# Patient Record
Sex: Female | Born: 1935 | Race: White | Hispanic: No | Smoking: Never smoker
Health system: Southern US, Community
[De-identification: ages and names within clinical notes are randomized; demographics above are authoritative.]

## PROBLEM LIST (undated history)

## (undated) DIAGNOSIS — N390 Urinary tract infection, site not specified: Secondary | ICD-10-CM

## (undated) DIAGNOSIS — D649 Anemia, unspecified: Secondary | ICD-10-CM

## (undated) DIAGNOSIS — K219 Gastro-esophageal reflux disease without esophagitis: Secondary | ICD-10-CM

## (undated) DIAGNOSIS — I350 Nonrheumatic aortic (valve) stenosis: Secondary | ICD-10-CM

## (undated) DIAGNOSIS — H3530 Unspecified macular degeneration: Secondary | ICD-10-CM

## (undated) DIAGNOSIS — E119 Type 2 diabetes mellitus without complications: Secondary | ICD-10-CM

## (undated) DIAGNOSIS — G629 Polyneuropathy, unspecified: Secondary | ICD-10-CM

## (undated) DIAGNOSIS — H547 Unspecified visual loss: Secondary | ICD-10-CM

## (undated) DIAGNOSIS — I10 Essential (primary) hypertension: Secondary | ICD-10-CM

## (undated) DIAGNOSIS — M810 Age-related osteoporosis without current pathological fracture: Secondary | ICD-10-CM

## (undated) HISTORY — DX: Unspecified visual loss: H54.7

## (undated) HISTORY — DX: Polyneuropathy, unspecified: G62.9

## (undated) HISTORY — DX: Nonrheumatic aortic (valve) stenosis: I35.0

## (undated) HISTORY — PX: COLONOSCOPY: SHX174

## (undated) HISTORY — DX: Age-related osteoporosis without current pathological fracture: M81.0

## (undated) HISTORY — DX: Hypercalcemia: E83.52

## (undated) HISTORY — DX: Disorder of phosphorus metabolism, unspecified: E83.30

## (undated) HISTORY — DX: Essential (primary) hypertension: I10

## (undated) HISTORY — DX: Gastro-esophageal reflux disease without esophagitis: K21.9

---
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
## 1982-11-17 HISTORY — PX: CATARACT EXTRACTION: SUR2

## 1991-12-11 HISTORY — PX: CATARACT EXTRACTION: SUR2

## 2016-08-02 ENCOUNTER — Other Ambulatory Visit: Payer: Self-pay

## 2016-08-02 ENCOUNTER — Observation Stay (HOSPITAL_COMMUNITY)
Admission: EM | Admit: 2016-08-02 | Discharge: 2016-08-04 | Disposition: A | Discharge status: Home / Self Care | Payer: Medicare Other | Attending: Internal Medicine | Admitting: Internal Medicine

## 2016-08-02 ENCOUNTER — Emergency Department (HOSPITAL_COMMUNITY): Payer: Medicare Other

## 2016-08-02 ENCOUNTER — Encounter (HOSPITAL_COMMUNITY): Payer: Self-pay | Admitting: Emergency Medicine

## 2016-08-02 DIAGNOSIS — Z7984 Long term (current) use of oral hypoglycemic drugs: Secondary | ICD-10-CM | POA: Insufficient documentation

## 2016-08-02 DIAGNOSIS — K219 Gastro-esophageal reflux disease without esophagitis: Secondary | ICD-10-CM | POA: Diagnosis not present

## 2016-08-02 DIAGNOSIS — D649 Anemia, unspecified: Secondary | ICD-10-CM

## 2016-08-02 DIAGNOSIS — I350 Nonrheumatic aortic (valve) stenosis: Secondary | ICD-10-CM | POA: Diagnosis not present

## 2016-08-02 DIAGNOSIS — N390 Urinary tract infection, site not specified: Secondary | ICD-10-CM | POA: Diagnosis not present

## 2016-08-02 DIAGNOSIS — D509 Iron deficiency anemia, unspecified: Secondary | ICD-10-CM | POA: Diagnosis not present

## 2016-08-02 DIAGNOSIS — I119 Hypertensive heart disease without heart failure: Secondary | ICD-10-CM | POA: Insufficient documentation

## 2016-08-02 DIAGNOSIS — R600 Localized edema: Secondary | ICD-10-CM | POA: Insufficient documentation

## 2016-08-02 DIAGNOSIS — L03115 Cellulitis of right lower limb: Secondary | ICD-10-CM | POA: Diagnosis present

## 2016-08-02 DIAGNOSIS — Z79899 Other long term (current) drug therapy: Secondary | ICD-10-CM | POA: Insufficient documentation

## 2016-08-02 DIAGNOSIS — I700 Atherosclerosis of aorta: Secondary | ICD-10-CM | POA: Insufficient documentation

## 2016-08-02 DIAGNOSIS — R9389 Abnormal findings on diagnostic imaging of other specified body structures: Secondary | ICD-10-CM

## 2016-08-02 DIAGNOSIS — R609 Edema, unspecified: Secondary | ICD-10-CM | POA: Diagnosis present

## 2016-08-02 DIAGNOSIS — I10 Essential (primary) hypertension: Secondary | ICD-10-CM | POA: Diagnosis present

## 2016-08-02 DIAGNOSIS — E119 Type 2 diabetes mellitus without complications: Secondary | ICD-10-CM

## 2016-08-02 DIAGNOSIS — R0602 Shortness of breath: Secondary | ICD-10-CM

## 2016-08-02 DIAGNOSIS — R799 Abnormal finding of blood chemistry, unspecified: Secondary | ICD-10-CM | POA: Diagnosis present

## 2016-08-02 HISTORY — DX: Unspecified macular degeneration: H35.30

## 2016-08-02 HISTORY — DX: Type 2 diabetes mellitus without complications: E11.9

## 2016-08-02 HISTORY — DX: Anemia, unspecified: D64.9

## 2016-08-02 HISTORY — DX: Nonrheumatic aortic (valve) stenosis: I35.0

## 2016-08-02 LAB — COMPREHENSIVE METABOLIC PANEL
ALK PHOS: 69 U/L (ref 38–126)
ALT: 17 U/L (ref 14–54)
ANION GAP: 12 (ref 5–15)
AST: 22 U/L (ref 15–41)
Albumin: 3.9 g/dL (ref 3.5–5.0)
BILIRUBIN TOTAL: 0.4 mg/dL (ref 0.3–1.2)
BUN: 19 mg/dL (ref 6–20)
CALCIUM: 9.5 mg/dL (ref 8.9–10.3)
CO2: 21 mmol/L — ABNORMAL LOW (ref 22–32)
CREATININE: 0.7 mg/dL (ref 0.44–1.00)
Chloride: 100 mmol/L — ABNORMAL LOW (ref 101–111)
GFR calc Af Amer: >60 mL/min (ref >60)
GFR calc non Af Amer: >60 mL/min (ref >60)
GLUCOSE: 163 mg/dL — AB (ref 65–99)
Potassium: 3.8 mmol/L (ref 3.5–5.1)
Sodium: 133 mmol/L — ABNORMAL LOW (ref 135–145)
TOTAL PROTEIN: 6.8 g/dL (ref 6.5–8.1)

## 2016-08-02 LAB — CBC WITH DIFFERENTIAL/PLATELET
BASOS ABS: 0 10*3/uL (ref 0.0–0.1)
Basophils Relative: 0 %
Eosinophils Absolute: 0.3 10*3/uL (ref 0.0–0.7)
Eosinophils Relative: 4 %
HEMATOCRIT: 23.3 % — AB (ref 36.0–46.0)
HEMOGLOBIN: 7.1 g/dL — AB (ref 12.0–15.0)
LYMPHS PCT: 25 %
Lymphs Abs: 1.6 10*3/uL (ref 0.7–4.0)
MCH: 21 pg — AB (ref 26.0–34.0)
MCHC: 30.5 g/dL (ref 30.0–36.0)
MCV: 68.9 fL — ABNORMAL LOW (ref 78.0–100.0)
MONO ABS: 0.7 10*3/uL (ref 0.1–1.0)
MONOS PCT: 11 %
NEUTROS ABS: 3.9 10*3/uL (ref 1.7–7.7)
Neutrophils Relative %: 60 %
Platelets: 260 10*3/uL (ref 150–400)
RBC: 3.38 MIL/uL — ABNORMAL LOW (ref 3.87–5.11)
RDW: 17.7 % — ABNORMAL HIGH (ref 11.5–15.5)
WBC: 6.5 10*3/uL (ref 4.0–10.5)

## 2016-08-02 LAB — URINALYSIS, ROUTINE W REFLEX MICROSCOPIC
BILIRUBIN URINE: NEGATIVE
Glucose, UA: NEGATIVE mg/dL
Hgb urine dipstick: NEGATIVE
Ketones, ur: 5 mg/dL — AB
NITRITE: NEGATIVE
PH: 5 (ref 5.0–8.0)
Protein, ur: NEGATIVE mg/dL
SPECIFIC GRAVITY, URINE: 1.009 (ref 1.005–1.030)

## 2016-08-02 LAB — BRAIN NATRIURETIC PEPTIDE: B Natriuretic Peptide: 52.3 pg/mL (ref 0.0–100.0)

## 2016-08-02 NOTE — ED Triage Notes (Signed)
Pt here after her doctor told her to come seek treatment. Pt reports her hemoglobin is low. Pt's daughter states her hemoglobin was 7.5. Pt reports sob with exertion x multiple months.

## 2016-08-03 ENCOUNTER — Other Ambulatory Visit: Payer: Self-pay

## 2016-08-03 ENCOUNTER — Observation Stay (HOSPITAL_BASED_OUTPATIENT_CLINIC_OR_DEPARTMENT_OTHER): Payer: Medicare Other

## 2016-08-03 ENCOUNTER — Encounter (HOSPITAL_COMMUNITY): Payer: Self-pay | Admitting: Emergency Medicine

## 2016-08-03 DIAGNOSIS — D649 Anemia, unspecified: Secondary | ICD-10-CM | POA: Diagnosis not present

## 2016-08-03 DIAGNOSIS — M7989 Other specified soft tissue disorders: Secondary | ICD-10-CM

## 2016-08-03 DIAGNOSIS — R609 Edema, unspecified: Secondary | ICD-10-CM | POA: Diagnosis not present

## 2016-08-03 DIAGNOSIS — D509 Iron deficiency anemia, unspecified: Secondary | ICD-10-CM | POA: Diagnosis present

## 2016-08-03 DIAGNOSIS — N390 Urinary tract infection, site not specified: Secondary | ICD-10-CM

## 2016-08-03 LAB — CBC WITH DIFFERENTIAL/PLATELET
Basophils Absolute: 0 10*3/uL (ref 0.0–0.1)
Basophils Relative: 0 %
Eosinophils Absolute: 0.1 10*3/uL (ref 0.0–0.7)
Eosinophils Relative: 2 %
HEMATOCRIT: 27.1 % — AB (ref 36.0–46.0)
HEMOGLOBIN: 8.7 g/dL — AB (ref 12.0–15.0)
LYMPHS ABS: 1.2 10*3/uL (ref 0.7–4.0)
LYMPHS PCT: 21 %
MCH: 22.5 pg — AB (ref 26.0–34.0)
MCHC: 32.1 g/dL (ref 30.0–36.0)
MCV: 70 fL — AB (ref 78.0–100.0)
Monocytes Absolute: 0.7 10*3/uL (ref 0.1–1.0)
Monocytes Relative: 12 %
NEUTROS ABS: 3.6 10*3/uL (ref 1.7–7.7)
NEUTROS PCT: 65 %
Platelets: 249 10*3/uL (ref 150–400)
RBC: 3.87 MIL/uL (ref 3.87–5.11)
RDW: 17.9 % — ABNORMAL HIGH (ref 11.5–15.5)
WBC: 5.7 10*3/uL (ref 4.0–10.5)

## 2016-08-03 LAB — POCT I-STAT TROPONIN I: Troponin i, poc: 0.01 ng/mL (ref 0.00–0.08)

## 2016-08-03 LAB — GLUCOSE, CAPILLARY
GLUCOSE-CAPILLARY: 144 mg/dL — AB (ref 65–99)
GLUCOSE-CAPILLARY: 182 mg/dL — AB (ref 65–99)
GLUCOSE-CAPILLARY: 125 mg/dL — AB (ref 65–99)
Glucose-Capillary: 138 mg/dL — ABNORMAL HIGH (ref 65–99)

## 2016-08-03 LAB — VITAMIN B12: Vitamin B-12: 215 pg/mL (ref 180–914)

## 2016-08-03 LAB — FERRITIN: FERRITIN: 5 ng/mL — AB (ref 11–307)

## 2016-08-03 LAB — HCG, SERUM, QUALITATIVE: Preg, Serum: NEGATIVE

## 2016-08-03 LAB — IRON AND TIBC
Iron: 16 ug/dL — ABNORMAL LOW (ref 28–170)
SATURATION RATIOS: 3 % — AB (ref 10.4–31.8)
TIBC: 469 ug/dL — ABNORMAL HIGH (ref 250–450)
UIBC: 453 ug/dL

## 2016-08-03 LAB — PREPARE RBC (CROSSMATCH)

## 2016-08-03 LAB — BRAIN NATRIURETIC PEPTIDE: B NATRIURETIC PEPTIDE 5: 94.4 pg/mL (ref 0.0–100.0)

## 2016-08-03 LAB — OCCULT BLOOD X 1 CARD TO LAB, STOOL: FECAL OCCULT BLD: NEGATIVE

## 2016-08-03 LAB — POCT PREGNANCY, URINE: Preg Test, Ur: POSITIVE — AB

## 2016-08-03 LAB — ABO/RH: ABO/RH(D): B POS

## 2016-08-03 LAB — HCG, QUANTITATIVE, PREGNANCY: hCG, Beta Chain, Quant, S: 1 m[IU]/mL (ref <5)

## 2016-08-03 MED ORDER — POLYVINYL ALCOHOL 1.4 % OP SOLN
2.0000 [drp] | OPHTHALMIC | Status: DC | PRN
  Administered 2016-08-03: 2 [drp] via OPHTHALMIC
  Filled 2016-08-03: qty 15

## 2016-08-03 MED ORDER — IBUPROFEN 200 MG PO TABS
400.0000 mg | ORAL_TABLET | Freq: Once | ORAL | Status: AC
  Administered 2016-08-03: 400 mg via ORAL
  Filled 2016-08-03: qty 2

## 2016-08-03 MED ORDER — SODIUM CHLORIDE 0.9 % IV SOLN
Freq: Once | INTRAVENOUS | Status: AC
  Administered 2016-08-03: 01:00:00 via INTRAVENOUS

## 2016-08-03 MED ORDER — FAMOTIDINE 20 MG PO TABS
20.0000 mg | ORAL_TABLET | Freq: Every day | ORAL | Status: DC
  Administered 2016-08-03 (×2): 20 mg via ORAL
  Filled 2016-08-03 (×2): qty 1

## 2016-08-03 MED ORDER — RISAQUAD PO CAPS
1.0000 | ORAL_CAPSULE | Freq: Every day | ORAL | Status: DC
  Administered 2016-08-03 – 2016-08-04 (×2): 1 via ORAL
  Filled 2016-08-03 (×2): qty 1

## 2016-08-03 MED ORDER — PANTOPRAZOLE SODIUM 40 MG IV SOLR
40.0000 mg | Freq: Two times a day (BID) | INTRAVENOUS | Status: DC
  Administered 2016-08-03 – 2016-08-04 (×3): 40 mg via INTRAVENOUS
  Filled 2016-08-03 (×3): qty 40

## 2016-08-03 MED ORDER — POLYETHYLENE GLYCOL 3350 17 G PO PACK: 17.0000 g | PACK | Freq: Every day | ORAL | Status: DC | PRN

## 2016-08-03 MED ORDER — SALINE SPRAY 0.65 % NA SOLN: 2.0000 | NASAL | Status: DC | PRN

## 2016-08-03 MED ORDER — SODIUM CHLORIDE 0.9 % IV SOLN
510.0000 mg | Freq: Once | INTRAVENOUS | Status: AC
  Administered 2016-08-03: 510 mg via INTRAVENOUS
  Filled 2016-08-03: qty 17

## 2016-08-03 MED ORDER — SODIUM CHLORIDE 0.9 % IV SOLN: 25.0000 mg | Freq: Once | INTRAVENOUS | Status: DC

## 2016-08-03 MED ORDER — INSULIN ASPART 100 UNIT/ML ~~LOC~~ SOLN
0.0000 [IU] | Freq: Three times a day (TID) | SUBCUTANEOUS | Status: DC
  Administered 2016-08-03: 3 [IU] via SUBCUTANEOUS
  Administered 2016-08-03 (×2): 2 [IU] via SUBCUTANEOUS
  Administered 2016-08-04 (×2): 3 [IU] via SUBCUTANEOUS

## 2016-08-03 MED ORDER — ONDANSETRON HCL 4 MG PO TABS: 4.0000 mg | ORAL_TABLET | Freq: Four times a day (QID) | ORAL | Status: DC | PRN

## 2016-08-03 MED ORDER — ACETAMINOPHEN 325 MG PO TABS
650.0000 mg | ORAL_TABLET | Freq: Four times a day (QID) | ORAL | Status: DC | PRN
  Filled 2016-08-03: qty 2

## 2016-08-03 MED ORDER — PANTOPRAZOLE SODIUM 40 MG PO TBEC
80.0000 mg | DELAYED_RELEASE_TABLET | Freq: Every day | ORAL | Status: DC
Start: 2016-08-03 — End: 2016-08-03
  Filled 2016-08-03: qty 2

## 2016-08-03 MED ORDER — CALCIUM CARBONATE ANTACID 500 MG PO CHEW
2.0000 | CHEWABLE_TABLET | Freq: Every day | ORAL | Status: DC
  Administered 2016-08-03 – 2016-08-04 (×2): 400 mg via ORAL
  Filled 2016-08-03 (×2): qty 2

## 2016-08-03 MED ORDER — SODIUM CHLORIDE 0.9 % IV SOLN
Freq: Once | INTRAVENOUS | Status: AC
  Administered 2016-08-03: 02:00:00 via INTRAVENOUS

## 2016-08-03 MED ORDER — ONDANSETRON HCL 4 MG/2ML IJ SOLN: 4.0000 mg | Freq: Four times a day (QID) | INTRAMUSCULAR | Status: DC | PRN

## 2016-08-03 MED ORDER — ACETAMINOPHEN 650 MG RE SUPP: 650.0000 mg | Freq: Four times a day (QID) | RECTAL | Status: DC | PRN

## 2016-08-03 NOTE — Progress Notes (Signed)
**  Preliminary report by tech**  Bilateral lower extremity venous duplex completed. There is no evidence of deep or superficial vein thrombosis involving the right and left lower extremities. All visualized vessels appear patent and compressible. There is no evidence of Baker's cysts bilaterally.  08/03/16 9:12 AM Olen CordialGreg Karlynn Furrow RVT

## 2016-08-03 NOTE — Care Management Obs Status (Signed)
MEDICARE OBSERVATION STATUS NOTIFICATION   Patient Details  Name: Stacey Ritter MRN: [id number] Date of Birth: 27-Aug-1926   Medicare Observation Status Notification Given:  Yes    Lanier ClamMahabir, Dereon Corkery, RN 08/03/2016, 12:32 PM

## 2016-08-03 NOTE — ED Provider Notes (Signed)
[facility]-EMERGENCY DEPT Provider Note   CSN: [id number] Arrival date & time: 08/02/16  2003  By signing my name below, I, Stacey Ritter, attest that this documentation has been prepared under the direction and in the presence of Stacey Clopper, MD  Electronically Signed: Thelma BargeNick Ritter, Scribe. 08/03/16. 12:38 AM. History   Chief Complaint Chief Complaint  Patient presents with  . Abnormal Lab   The history is provided by the patient and a relative. No language interpreter was used.  Shortness of Breath  This is a new problem. The problem occurs continuously.The current episode started more than 1 week ago. The problem has been gradually worsening. Associated symptoms include leg swelling. Pertinent negatives include no fever, no hemoptysis, no chest pain, no rash and no claudication. It is unknown what precipitated the problem. Risk factors: none. She has tried nothing for the symptoms. The treatment provided no relief. She has had no prior ICU admissions. Associated medical issues do not include PE.    HPI Comments: Stacey Ritter is a 83 y.o. female with PMHx of anemia, GERD, DM, HTN, HLD, and macular degeneration who presents to the Emergency Department complaining of constant, gradually worsening bilateral lower extremity swelling for 2 weeks. This is a new issue. She has associated exertional SOB (but this is not a new concern), leg cramps, and decreased appetite. [facility] and they reported her hemoglobin was 7.5 and referred her to come here. She does not know her baseline hemoglobin. She denies fever, melena, hematuria, and rashes. Pt is here visiting her daughter since July 1 until August and has not seen her PCP since Stacey Ritter. She [facility] in [place]. Past Medical History:  Diagnosis Date  . Anemia   . Diabetes mellitus without complication (HCC)   . Macular degeneration     There are no active problems to display for this  patient.   History reviewed. No pertinent surgical history.  OB History    No data available       Home Medications    Prior to Admission medications   Medication Sig Start Date End Date Taking? Authorizing Provider  acidophilus (RISAQUAD) CAPS capsule Take 1 capsule by mouth daily.   Yes [provider]  calcium carbonate (TUMS - DOSED IN MG ELEMENTAL CALCIUM) 500 MG chewable tablet Chew 2 tablets by mouth daily.   Yes [provider]  esomeprazole (NEXIUM) 40 MG capsule Take 40 mg by mouth daily at 12 noon.   Yes [provider]  glimepiride (AMARYL) 4 MG tablet Take 4 mg by mouth daily with breakfast.   Yes [provider]  Menthol, Topical Analgesic, (BIOFREEZE EX) Apply 1 application topically daily as needed (pain).   Yes [provider]  Multiple Vitamins-Minerals (ICAPS MV PO) Take 1 tablet by mouth daily.   Yes [provider]  polyethylene glycol (MIRALAX / GLYCOLAX) packet Take 17 g by mouth daily as needed for mild constipation.   Yes [provider]  ranitidine (ZANTAC) 150 MG tablet Take 150 mg by mouth at bedtime.   Yes [provider]  sitaGLIPtin-metformin (JANUMET) 50-1000 MG tablet Take 1 tablet by mouth 2 (two) times daily with a meal.   Yes [provider]  sodium chloride (OCEAN) 0.65 % SOLN nasal spray Place 2 sprays into both nostrils as needed for congestion.   Yes [provider]  Wheat Dextrin (BENEFIBER PO) Take 1-2 tablets by mouth daily as needed (constipation).  Yes [provider]    Family History No family history on file.  Social History Social History  Substance Use Topics  . Smoking status: Never Smoker  . Smokeless tobacco: Never Used  . Alcohol use No     Allergies   Ampicillin and Penicillins   Review of Systems Review of Systems  Constitutional: Positive for appetite change. Negative for chills and fever.  HENT: Negative for  drooling and facial swelling.   Eyes: Negative for photophobia.  Respiratory: Positive for shortness of breath. Negative for hemoptysis.   Cardiovascular: Positive for leg swelling. Negative for chest pain, palpitations and claudication.  Gastrointestinal: Negative for anal bleeding.  Genitourinary: Negative for difficulty urinating and hematuria.  Musculoskeletal: Positive for joint swelling. Negative for neck stiffness.       Leg cramping  Skin: Negative for pallor and rash.  Neurological: Negative for facial asymmetry and speech difficulty.  Psychiatric/Behavioral: Negative for suicidal ideas.  All other systems reviewed and are negative.    Physical Exam Updated Vital Signs BP (!) 165/70 (BP Location: Right Arm)   Pulse 80   Temp 98.4 F (36.9 C) (Oral)   Resp 17   Wt 150 lb (68 kg)   SpO2 95%   Physical Exam  Constitutional: She is oriented to person, place, and time. She appears well-developed and well-nourished. No distress.  HENT:  Head: Normocephalic and atraumatic.  Mouth/Throat: No oropharyngeal exudate.  Eyes: Pupils are equal, round, and reactive to light. Conjunctivae and EOM are normal.  Neck: Normal range of motion. Neck supple. No JVD present.  Cardiovascular: Normal rate and regular rhythm.   Pulmonary/Chest: Breath sounds normal. No respiratory distress. She has no wheezes.  All compartments are soft Intact dorsalis pedis pulse Regular rate, rhythm clear lungs, No struider, bruits, or JVD  Abdominal: Soft. Bowel sounds are normal. She exhibits no distension. There is no tenderness. There is no rebound and no guarding.  Genitourinary: No vaginal discharge found.  Musculoskeletal: Normal range of motion. She exhibits edema.  Bilateral pedal edema  Neurological: She is alert and oriented to person, place, and time. She displays normal reflexes.  Skin: Skin is warm and dry. Capillary refill takes less than 2 seconds. No rash noted. No erythema.    Psychiatric: She has a normal mood and affect.  Nursing note and vitals reviewed.    ED Treatments / Results  DIAGNOSTIC STUDIES: Oxygen Saturation is 95% on RA, normal by my interpretation.    COORDINATION OF CARE: 12:38 AM Discussed treatment plan with pt at bedside and pt agreed to plan.  Labs (all labs ordered are listed, but only abnormal results are displayed)  Results for orders placed or performed during the hospital encounter of 08/02/16  CBC with Differential/Platelet  Result Value Ref Range   WBC 6.5 4.0 - 10.5 K/uL   RBC 3.38 (L) 3.87 - 5.11 MIL/uL   Hemoglobin 7.1 (L) 12.0 - 15.0 g/dL   HCT 16.1 (L) 09.6 - 04.5 %   MCV 68.9 (L) 78.0 - 100.0 fL   MCH 21.0 (L) 26.0 - 34.0 pg   MCHC 30.5 30.0 - 36.0 g/dL   RDW 40.9 (H) 81.1 - 91.4 %   Platelets 260 150 - 400 K/uL   Neutrophils Relative % 60 %   Neutro Abs 3.9 1.7 - 7.7 K/uL   Lymphocytes Relative 25 %   Lymphs Abs 1.6 0.7 - 4.0 K/uL   Monocytes Relative 11 %   Monocytes Absolute 0.7 0.1 -  1.0 K/uL   Eosinophils Relative 4 %   Eosinophils Absolute 0.3 0.0 - 0.7 K/uL   Basophils Relative 0 %   Basophils Absolute 0.0 0.0 - 0.1 K/uL   Smear Review MORPHOLOGY UNREMARKABLE   Comprehensive metabolic panel  Result Value Ref Range   Sodium 133 (L) 135 - 145 mmol/L   Potassium 3.8 3.5 - 5.1 mmol/L   Chloride 100 (L) 101 - 111 mmol/L   CO2 21 (L) 22 - 32 mmol/L   Glucose, Bld 163 (H) 65 - 99 mg/dL   BUN 19 6 - 20 mg/dL   Creatinine, Ser 1.02 0.44 - 1.00 mg/dL   Calcium 9.5 8.9 - 72.5 mg/dL   Total Protein 6.8 6.5 - 8.1 g/dL   Albumin 3.9 3.5 - 5.0 g/dL   AST 22 15 - 41 U/L   ALT 17 14 - 54 U/L   Alkaline Phosphatase 69 38 - 126 U/L   Total Bilirubin 0.4 0.3 - 1.2 mg/dL   GFR calc non Af Amer >60 >60 mL/min   GFR calc Af Amer >60 >60 mL/min   Anion gap 12 5 - 15  Brain natriuretic peptide  Result Value Ref Range   B Natriuretic Peptide 52.3 0.0 - 100.0 pg/mL  Urinalysis, Routine w reflex microscopic   Result Value Ref Range   Color, Urine YELLOW YELLOW   APPearance CLEAR CLEAR   Specific Gravity, Urine 1.009 1.005 - 1.030   pH 5.0 5.0 - 8.0   Glucose, UA NEGATIVE NEGATIVE mg/dL   Hgb urine dipstick NEGATIVE NEGATIVE   Bilirubin Urine NEGATIVE NEGATIVE   Ketones, ur 5 (A) NEGATIVE mg/dL   Protein, ur NEGATIVE NEGATIVE mg/dL   Nitrite NEGATIVE NEGATIVE   Leukocytes, UA MODERATE (A) NEGATIVE   RBC / HPF 0-5 0 - 5 RBC/hpf   WBC, UA 6-30 0 - 5 WBC/hpf   Bacteria, UA MANY (A) NONE SEEN   Squamous Epithelial / LPF 0-5 (A) NONE SEEN   Dg Chest 2 View  Result Date: 08/02/2016 CLINICAL DATA:  Shortness of Breath EXAM: CHEST  2 VIEW COMPARISON:  08/02/2016 FINDINGS: Cardiac shadow is stable in appearance. Aortic calcifications are again seen. Lungs are well aerated bilaterally. A vague nodular density is noted in the right upper lobe similar to that seen on chest x-ray obtained 4 hours previous. No acute bony abnormality is seen. Stable lower thoracic compression deformity is noted. IMPRESSION: Stable nodular changes in the right upper lobe similar to that seen 4 hours previous. CT is again recommended for further evaluation. Electronically Signed   By: Alcide Clever M.D.   On: 08/02/2016 22:23    EKG   EKG Interpretation  Date/Time:  Tuesday August 03 2016 00:47:19 EDT Ventricular Rate:  81 PR Interval:    QRS Duration: 72 QT Interval:  356 QTC Calculation: 414 R Axis:   18 Text Interpretation:  Sinus rhythm Borderline prolonged PR interval Confirmed by Laquanda Bick ([id number]) on 08/03/2016 12:50:00 AM       Radiology Dg Chest 2 View  Result Date: 08/02/2016 CLINICAL DATA:  Shortness of Breath EXAM: CHEST  2 VIEW COMPARISON:  08/02/2016 FINDINGS: Cardiac shadow is stable in appearance. Aortic calcifications are again seen. Lungs are well aerated bilaterally. A vague nodular density is noted in the right upper lobe similar to that seen on chest x-ray obtained 4 hours previous. No acute  bony abnormality is seen. Stable lower thoracic compression deformity is noted. IMPRESSION: Stable nodular changes in the right upper  lobe similar to that seen 4 hours previous. CT is again recommended for further evaluation. Electronically Signed   By: Alcide Clever M.D.   On: 08/02/2016 22:23    Procedures Procedures (including critical care time)     Final Clinical Impressions(s) / ED Diagnoses   Symptomatic anemia and peripheral edema: will admit to medicine  I personally performed the services described in this documentation, which was scribed in my presence. The recorded information has been reviewed and is accurate.       Danean Marner, MD 08/03/16 [phone]

## 2016-08-03 NOTE — Progress Notes (Addendum)
  PROGRESS NOTE  Patient admitted earlier this morning. See H&P. Patient currently residing in [place] who is in town visiting daughter. About 2 weeks ago, she started to notice bilateral lower extremity swelling, leg cramping, and had worsening shortness of breath. She went to an urgent care to get her legs checked out, was told her hemoglobin was low, and [facility]   Exam shows +3 systolic heart murmur, which per patient is chronic Bilateral +1 LE edema  Vascular US negative for DVT Check echo  Urine pregnancy positive. She is 83 yo. Checked serum preg test which is negative. This was a lab error.  Has hx of iron deficiency anemia but has not been taking iron supplements, no visible blood loss per patient. She received 1u pRBC. FOBT is pending. Her last colonoscopy was 8 years ago which was normal, last EGD few years ago found hiatal hernia. She has GERD, takes baby asprin and rare NSAIDs.  Iron IV supplement    Stacey StainJennifer Rudi Knippenberg, DO [facility] [email or web address] Password [email or web address] 08/03/2016, 9:50 AM

## 2016-08-03 NOTE — H&P (Signed)
History and Physical    Anaiyah Anglemyer UJW:[id number] DOB: [birth year] DOA: 08/02/2016  PCP: Patient, No Pcp Per (PCP is in [place])  Patient coming from: Home  I have personally briefly reviewed patient's old medical records in [facility]  Chief Complaint: Leg swelling  HPI: Kami Kube is a 83 y.o. female with medical history significant of DM2, macular degeneration, chronic anemia (unknown baseline HGB).  Patient was diagnosed previously with iron deficiency anemia and told to start iron supplementation earlier this month; however, patient didn't start on this.  Since traveling down from [place] around July 1st or so patient has had persistent BLE edema.  This has been gradually worsening for past 2 weeks.  Has associated exertional SOB but this isnt new.  Also has leg cramps and decreased appetite.  Patient went to UC earlier today, HGB 7.5.  Got sent to ED.   ED Course: HGB 7.1.   Review of Systems: As per HPI otherwise 10 point review of systems negative.   Past Medical History:  Diagnosis Date  . Anemia   . Diabetes mellitus without complication (HCC)   . Macular degeneration     History reviewed. No pertinent surgical history.   reports that she has never smoked. She has never used smokeless tobacco. She reports that she does not drink alcohol or use drugs.  Allergies  Allergen Reactions  . Ampicillin Rash  . Penicillins Rash    Has patient had a PCN reaction causing immediate rash, facial/tongue/throat swelling, SOB or lightheadedness with hypotension: Yes Has patient had a PCN reaction causing severe rash involving mucus membranes or skin necrosis: No Has patient had a PCN reaction that required hospitalization: No Has patient had a PCN reaction occurring within the last 10 years: No If all of the above answers are "NO", then may proceed with Cephalosporin use.     No family history on file.   Prior to Admission medications     Medication Sig Start Date End Date Taking? Authorizing Provider  acidophilus (RISAQUAD) CAPS capsule Take 1 capsule by mouth daily.   Yes [provider]  calcium carbonate (TUMS - DOSED IN MG ELEMENTAL CALCIUM) 500 MG chewable tablet Chew 2 tablets by mouth daily.   Yes [provider]  esomeprazole (NEXIUM) 40 MG capsule Take 40 mg by mouth daily at 12 noon.   Yes [provider]  glimepiride (AMARYL) 4 MG tablet Take 4 mg by mouth daily with breakfast.   Yes [provider]  Menthol, Topical Analgesic, (BIOFREEZE EX) Apply 1 application topically daily as needed (pain).   Yes [provider]  Multiple Vitamins-Minerals (ICAPS MV PO) Take 1 tablet by mouth daily.   Yes [provider]  polyethylene glycol (MIRALAX / GLYCOLAX) packet Take 17 g by mouth daily as needed for mild constipation.   Yes [provider]  ranitidine (ZANTAC) 150 MG tablet Take 150 mg by mouth at bedtime.   Yes [provider]  sitaGLIPtin-metformin (JANUMET) 50-1000 MG tablet Take 1 tablet by mouth 2 (two) times daily with a meal.   Yes [provider]  sodium chloride (OCEAN) 0.65 % SOLN nasal spray Place 2 sprays into both nostrils as needed for congestion.   Yes [provider]  Wheat Dextrin (BENEFIBER PO) Take 1-2 tablets by mouth daily as needed (constipation).   Yes [provider]    Physical Exam: Vitals:   08/03/16 0000 08/03/16 0030 08/03/16 0100 08/03/16 0115  BP: (!) 155/66 123/72 Marland Kitchen)  147/71   Pulse: 85 90 81 82  Resp: (!) 21 15 16 17   Temp:      TempSrc:      SpO2: 97% 100% 97% 98%  Weight:        Constitutional: NAD, calm, comfortable Eyes: PERRL, lids and conjunctivae normal ENMT: Mucous membranes are moist. Posterior pharynx clear of any exudate or lesions.Normal dentition.  Neck: normal, supple, no masses, no thyromegaly Respiratory: clear to auscultation bilaterally, no wheezing, no crackles.  Normal respiratory effort. No accessory muscle use.  Cardiovascular: Regular rate and rhythm, no murmurs / rubs / gallops. No extremity edema. 2+ pedal pulses. No carotid bruits.  Abdomen: no tenderness, no masses palpated. No hepatosplenomegaly. Bowel sounds positive.  Musculoskeletal: no clubbing / cyanosis. No joint deformity upper and lower extremities. Good ROM, no contractures. Normal muscle tone.  Skin: no rashes, lesions, ulcers. No induration Neurologic: CN 2-12 grossly intact. Sensation intact, DTR normal. Strength 5/5 in all 4.  Psychiatric: Normal judgment and insight. Alert and oriented x 3. Normal mood.    Labs on Admission: I have personally reviewed following labs and imaging studies  CBC:  Recent Labs Lab 08/02/16 2151  WBC 6.5  NEUTROABS 3.9  HGB 7.1*  HCT 23.3*  MCV 68.9*  PLT 260   Basic Metabolic Panel:  Recent Labs Lab 08/02/16 2151  NA 133*  K 3.8  CL 100*  CO2 21*  GLUCOSE 163*  BUN 19  CREATININE 0.70  CALCIUM 9.5   GFR: CrCl cannot be calculated (Unknown ideal weight.). Liver Function Tests:  Recent Labs Lab 08/02/16 2151  AST 22  ALT 17  ALKPHOS 69  BILITOT 0.4  PROT 6.8  ALBUMIN 3.9   No results for input(s): LIPASE, AMYLASE in the last 168 hours. No results for input(s): AMMONIA in the last 168 hours. Coagulation Profile: No results for input(s): INR, PROTIME in the last 168 hours. Cardiac Enzymes: No results for input(s): CKTOTAL, CKMB, CKMBINDEX, TROPONINI in the last 168 hours. BNP (last 3 results) No results for input(s): PROBNP in the last 8760 hours. HbA1C: No results for input(s): HGBA1C in the last 72 hours. CBG: No results for input(s): GLUCAP in the last 168 hours. Lipid Profile: No results for input(s): CHOL, HDL, LDLCALC, TRIG, CHOLHDL, LDLDIRECT in the last 72 hours. Thyroid Function Tests: No results for input(s): TSH, T4TOTAL, FREET4, T3FREE, THYROIDAB in the last 72 hours. Anemia Panel: No results for  input(s): VITAMINB12, FOLATE, FERRITIN, TIBC, IRON, RETICCTPCT in the last 72 hours. Urine analysis:    Component Value Date/Time   COLORURINE YELLOW 08/02/2016 2230   APPEARANCEUR CLEAR 08/02/2016 2230   LABSPEC 1.009 08/02/2016 2230   PHURINE 5.0 08/02/2016 2230   GLUCOSEU NEGATIVE 08/02/2016 2230   HGBUR NEGATIVE 08/02/2016 2230   BILIRUBINUR NEGATIVE 08/02/2016 2230   KETONESUR 5 (A) 08/02/2016 2230   PROTEINUR NEGATIVE 08/02/2016 2230   NITRITE NEGATIVE 08/02/2016 2230   LEUKOCYTESUR MODERATE (A) 08/02/2016 2230    Radiological Exams on Admission: Dg Chest 2 View  Result Date: 08/02/2016 CLINICAL DATA:  Shortness of Breath EXAM: CHEST  2 VIEW COMPARISON:  08/02/2016 FINDINGS: Cardiac shadow is stable in appearance. Aortic calcifications are again seen. Lungs are well aerated bilaterally. A vague nodular density is noted in the right upper lobe similar to that seen on chest x-ray obtained 4 hours previous. No acute bony abnormality is seen. Stable lower thoracic compression deformity is noted. IMPRESSION: Stable nodular changes in the right upper lobe similar to that  seen 4 hours previous. CT is again recommended for further evaluation. Electronically Signed   By: Alcide CleverMark  Lukens M.D.   On: 08/02/2016 22:23    EKG: Independently reviewed.  Assessment/Plan Principal Problem:   Iron deficiency anemia Active Problems:   Symptomatic anemia   Chronic UTI   Peripheral edema    1. Symptomatic Iron Deficiency anemia - 1. Presumably peripheral edema secondary to this 2. Will transfuse 1 unit PRBC 3. Hemoccult pending 2. Peripheral edema - 1. BLE edema, most likely secondary to anemia 2. But will get BLE US to r/o DVT 3. Chronic UTI - 1. Urine cx pending 2. No UTI symptoms, no SIRS, and doubt that this is causing her peripheral edema. 3. Given extensive h/o UTI on UAs in past (Patient says UA always shows UTI, "sometimes my doctor gives me an antibiotic and sometimes he doesn't")  and above, will hold off on ABx for now.  DVT prophylaxis: None pending hemoccult and BLE US to r/o DVT (though latter felt to be less likely). Code Status: Full Family Communication: Daughter at bedside Disposition Plan: Home after admit Consults called: None Admission status: Place in 55obs   Hillary BowGARDNER, Raine Blodgett M. DO [facility] Pager [phone]  If 7AM-7PM, please contact day team taking care of patient [email or web address] Password [email or web address]  08/03/2016, 1:37 AM

## 2016-08-03 NOTE — Care Management Note (Signed)
Case Management Note  Patient Details  Name: Jani FilesMargaret Diekmann MRN: [id number] Date of Birth: Dec 10, 1926  Subjective/Objective:     Spoke to patient/dtr in rm about ABN notice of non coverage-patient/dtr voiced understanding. MD/administration notified.                Action/Plan:d/c plan home.   Expected Discharge Date:                  Expected Discharge Plan:  Home/Self Care  In-House Referral:     Discharge planning Services  CM Consult  Post Acute Care Choice:    Choice offered to:     DME Arranged:    DME Agency:     HH Arranged:    HH Agency:     Status of Service:  In process, will continue to follow  If discussed at Long Length of Stay Meetings, dates discussed:    Additional Comments:  Lanier ClamMahabir, Keshan Reha, RN 08/03/2016, 3:45 PM

## 2016-08-03 NOTE — Care Management Note (Signed)
Case Management Note  Patient Details  Name: Stacey FilesMargaret Ritter MRN: [id number] Date of Birth: November 02, 1926  Subjective/Objective: 83 y/o f admitted w/Iron deficiency. Vis[place] from [place] in [place]. No further CM needs.                   Action/Plan:d/c plan home.   Expected Discharge Date:                  Expected Discharge Plan:  Home/Self Care  In-House Referral:     Discharge planning Services  CM Consult  Post Acute Care Choice:    Choice offered to:     DME Arranged:    DME Agency:     HH Arranged:    HH Agency:     Status of Service:  In process, will continue to follow  If discussed at Long Length of Stay Meetings, dates discussed:    Additional Comments:  Lanier ClamMahabir, Taylorann Tkach, RN 08/03/2016, 12:32 PM

## 2016-08-04 ENCOUNTER — Observation Stay (HOSPITAL_BASED_OUTPATIENT_CLINIC_OR_DEPARTMENT_OTHER): Payer: Medicare Other

## 2016-08-04 ENCOUNTER — Encounter (HOSPITAL_COMMUNITY): Payer: Self-pay | Admitting: Radiology

## 2016-08-04 ENCOUNTER — Observation Stay (HOSPITAL_COMMUNITY): Payer: Medicare Other

## 2016-08-04 DIAGNOSIS — E118 Type 2 diabetes mellitus with unspecified complications: Secondary | ICD-10-CM | POA: Diagnosis not present

## 2016-08-04 DIAGNOSIS — I340 Nonrheumatic mitral (valve) insufficiency: Secondary | ICD-10-CM

## 2016-08-04 DIAGNOSIS — D649 Anemia, unspecified: Secondary | ICD-10-CM | POA: Diagnosis not present

## 2016-08-04 DIAGNOSIS — I350 Nonrheumatic aortic (valve) stenosis: Secondary | ICD-10-CM

## 2016-08-04 DIAGNOSIS — L03115 Cellulitis of right lower limb: Secondary | ICD-10-CM | POA: Diagnosis not present

## 2016-08-04 DIAGNOSIS — D509 Iron deficiency anemia, unspecified: Secondary | ICD-10-CM

## 2016-08-04 DIAGNOSIS — E119 Type 2 diabetes mellitus without complications: Secondary | ICD-10-CM

## 2016-08-04 DIAGNOSIS — I10 Essential (primary) hypertension: Secondary | ICD-10-CM | POA: Diagnosis present

## 2016-08-04 DIAGNOSIS — R609 Edema, unspecified: Secondary | ICD-10-CM

## 2016-08-04 LAB — GLUCOSE, CAPILLARY
GLUCOSE-CAPILLARY: 185 mg/dL — AB (ref 65–99)
Glucose-Capillary: 183 mg/dL — ABNORMAL HIGH (ref 65–99)

## 2016-08-04 LAB — CBC
HEMATOCRIT: 25.6 % — AB (ref 36.0–46.0)
HEMOGLOBIN: 8.2 g/dL — AB (ref 12.0–15.0)
MCH: 22 pg — AB (ref 26.0–34.0)
MCHC: 32 g/dL (ref 30.0–36.0)
MCV: 68.8 fL — ABNORMAL LOW (ref 78.0–100.0)
Platelets: 200 10*3/uL (ref 150–400)
RBC: 3.72 MIL/uL — ABNORMAL LOW (ref 3.87–5.11)
RDW: 17.7 % — AB (ref 11.5–15.5)
WBC: 6.2 10*3/uL (ref 4.0–10.5)

## 2016-08-04 LAB — BPAM RBC
Blood Product Expiration Date: 201808032359
ISSUE DATE / TIME: 201807240148
Unit Type and Rh: 7300

## 2016-08-04 LAB — MAGNESIUM: Magnesium: 1.8 mg/dL (ref 1.7–2.4)

## 2016-08-04 LAB — ECHOCARDIOGRAM COMPLETE
HEIGHTINCHES: 61 in
Weight: 2384.5 oz

## 2016-08-04 LAB — TYPE AND SCREEN
ABO/RH(D): B POS
Antibody Screen: NEGATIVE
UNIT DIVISION: 0

## 2016-08-04 LAB — BASIC METABOLIC PANEL
Anion gap: 9 (ref 5–15)
BUN: 15 mg/dL (ref 6–20)
CALCIUM: 9.3 mg/dL (ref 8.9–10.3)
CHLORIDE: 103 mmol/L (ref 101–111)
CO2: 25 mmol/L (ref 22–32)
CREATININE: 0.7 mg/dL (ref 0.44–1.00)
GFR calc non Af Amer: >60 mL/min (ref >60)
Glucose, Bld: 154 mg/dL — ABNORMAL HIGH (ref 65–99)
Potassium: 3.8 mmol/L (ref 3.5–5.1)
SODIUM: 137 mmol/L (ref 135–145)

## 2016-08-04 MED ORDER — AMLODIPINE BESYLATE 5 MG PO TABS: 2.5000 mg | ORAL_TABLET | Freq: Every day | ORAL | Status: DC

## 2016-08-04 MED ORDER — HYDRALAZINE HCL 20 MG/ML IJ SOLN
5.0000 mg | Freq: Once | INTRAMUSCULAR | Status: DC
  Filled 2016-08-04: qty 0.25

## 2016-08-04 MED ORDER — PANTOPRAZOLE SODIUM 40 MG PO TBEC
40.0000 mg | DELAYED_RELEASE_TABLET | Freq: Two times a day (BID) | ORAL | Status: DC
  Filled 2016-08-04: qty 1

## 2016-08-04 MED ORDER — DOXYCYCLINE HYCLATE 100 MG PO TABS
100.0000 mg | ORAL_TABLET | Freq: Two times a day (BID) | ORAL | 0 refills | Status: AC
Start: 2016-08-04 — End: 2016-08-11

## 2016-08-04 MED ORDER — IOPAMIDOL (ISOVUE-370) INJECTION 76%
INTRAVENOUS | Status: AC
  Filled 2016-08-04: qty 100

## 2016-08-04 MED ORDER — POLYSACCHARIDE IRON COMPLEX 150 MG PO CAPS: 150.0000 mg | ORAL_CAPSULE | Freq: Every day | ORAL | Status: DC

## 2016-08-04 MED ORDER — POLYSACCHARIDE IRON COMPLEX 150 MG PO CAPS: 150.0000 mg | ORAL_CAPSULE | Freq: Every day | ORAL | 0 refills | Status: DC

## 2016-08-04 MED ORDER — IOPAMIDOL (ISOVUE-370) INJECTION 76%
100.0000 mL | Freq: Once | INTRAVENOUS | Status: AC | PRN
  Administered 2016-08-04: 100 mL via INTRAVENOUS

## 2016-08-04 MED ORDER — CARVEDILOL 3.125 MG PO TABS: 3.1250 mg | ORAL_TABLET | Freq: Two times a day (BID) | ORAL | 2 refills | Status: DC

## 2016-08-04 MED ORDER — CARVEDILOL 3.125 MG PO TABS
3.1250 mg | ORAL_TABLET | Freq: Two times a day (BID) | ORAL | Status: DC
  Administered 2016-08-04: 3.125 mg via ORAL
  Filled 2016-08-04: qty 1

## 2016-08-04 MED ORDER — DOXYCYCLINE HYCLATE 100 MG PO TABS
100.0000 mg | ORAL_TABLET | Freq: Two times a day (BID) | ORAL | Status: DC
  Administered 2016-08-04: 100 mg via ORAL
  Filled 2016-08-04: qty 1

## 2016-08-04 NOTE — Progress Notes (Signed)
  Echocardiogram 2D Echocardiogram has been performed.  Stacey Ritter 08/04/2016, 1:10 PM

## 2016-08-04 NOTE — Care Management Note (Signed)
Case Management Note  Patient Details  Name: Stacey FilesMargaret Ritter MRN: [id number] Date of Birth: 03-Oct-1926  Subjective/Objective:                    Action/Plan:d/c home.   Expected Discharge Date:  08/04/16               Expected Discharge Plan:  Home/Self Care  In-House Referral:     Discharge planning Services  CM Consult  Post Acute Care Choice:    Choice offered to:     DME Arranged:    DME Agency:     HH Arranged:    HH Agency:     Status of Service:  Completed, signed off  If discussed at [employer] Length of Stay Meetings, dates discussed:    Additional Comments:  Lanier ClamMahabir, Charlean Carneal, RN 08/04/2016, 4:04 PM

## 2016-08-04 NOTE — Progress Notes (Signed)
PHARMACIST - PHYSICIAN COMMUNICATION  DR:  Janee Mornhompson  CONCERNING: IV to Oral Route Change Policy  RECOMMENDATION: This patient is receiving Protonix by the intravenous route.  Based on criteria approved by the Pharmacy and Therapeutics Committee, the intravenous medication(s) is/are being converted to the equivalent oral dose form(s).   DESCRIPTION: These criteria include:  The patient is eating (either orally or via tube) and/or has been taking other orally administered medications for a least 24 hours  The patient has no evidence of active gastrointestinal bleeding or impaired GI absorption (gastrectomy, short bowel, patient on TNA or NPO).  If you have questions about this conversion, please contact the Pharmacy Department  []   [phone]( [phone] )  [facility] []   [phone]( [phone] )  [facility] []   [phone]( [phone] )  [facility] []   [phone]( [phone] )  [facility] [x]   [phone]( [phone] )  [facility]   Emily FilbertLilliston, Cortlandt Capuano ParisMichelle, [facility] SyracuseRPH 08/04/2016 9:38 AM

## 2016-08-04 NOTE — Progress Notes (Signed)
Went over medications and discharge paperwork. VSS.  Pt wheeled out by NT.

## 2016-08-04 NOTE — Discharge Summary (Addendum)
Physician Discharge Summary  Stacey FilesMargaret Ritter ZOX:[id number] DOB: August 18, 1926 DOA: 08/02/2016  PCP: Patient, No Pcp Per  Admit date: 08/02/2016 Discharge date: 08/04/2016  Time spent: 65 minutes  Recommendations for Outpatient Follow-up:  1. Follow-up with PCP in 2-3 weeks. On follow-up patient in need a CBC done to follow-up on H&H. Patient will also need a referral to follow-up with cardiology for her moderate aortic valvular stenosis.   Discharge Diagnoses:  Principal Problem:   Iron deficiency anemia Active Problems:   Symptomatic anemia   Chronic UTI   Peripheral edema   Cellulitis of leg, right   DM (diabetes mellitus) (HCC)   Aortic valve stenosis, moderate: Per 2-D echo 08/04/2016   HTN (hypertension)   Discharge Condition: Stable and improved  Diet recommendation: Heart healthy  Filed Weights   08/02/16 2052 08/03/16 0254  Weight: 68 kg (150 lb) 67.6 kg (149 lb 0.5 oz)    History of present illness:  Per Dr. Gracy RacerGardner  Stacey Ritter is a 83 y.o. female with medical history significant of DM2, macular degeneration, chronic anemia (unknown baseline HGB).  Patient was diagnosed previously with iron deficiency anemia and told to start iron supplementation earlier this month; however, patient didn't start on this.  [place] so patient has had persistent BLE edema.  This has been gradually worsening for past 2 weeks.  Has associated exertional SOB but this isnt new.  Also has leg cramps and decreased appetite.  Patient went to UC earlier today, HGB 7.5.  Got sent to ED.  Hospital Course:  #1 symptomatic iron deficiency anemia Patient had presented with worsening shortness of breath as well as lower extremity edema and noted to have a hemoglobin of 7.5. Patient had no overt bleeding during the hospitalization. Patient transfused 1 unit packed red blood cells with hemoglobin coming up to 8.2 remaining stable. Patient also  given a dose of IV Feraheme during the hospitalization. Patient will be started on nu iron 150 mg daily. Outpatient follow-up.  #2 peripheral edema Patient had presented bilateral lower extremity edema which was felt could have been secondary to symptomatic iron deficiency anemia versus dependent edema. Lower extremity Dopplers were obtained which were negative for DVT. CT angiogram of the chest was also obtained which was negative for PE. Patient's LFTs were within normal limits. Patient's renal function was also within normal limits. Urinalysis which was done was negative for proteinuria. 2-D echo which was done had a EF of 60-65%, no wall motion abnormalities, grade 1 diastolic dysfunction, moderate aortic valvular stenosis. Patient's lower extremity edema improved during the hospitalization. Outpatient follow-up.  #3 probable right greater than left lower extremity cellulitis During the hospitalization patient noted to have some erythema as well as some warmth right lower extremity greater than left. Patient remained afebrile. Patient will be discharged home empirically on a seven-day course of oral doxycycline. Outpatient follow-up.  #4 moderate aortic valvular stenosis Per 2-D echo. EF of 60-65%. Patient will need outpatient follow-up with cardiology for further evaluation and management.  #5 diabetes mellitus Remained stable throughout the hospitalization. Patient's oral hypoglycemic agents were held. Patient was maintained on sliding scale insulin. Outpatient follow-up.  #7 hypertension Patient noted to be hypertensive during the hospitalization and a such patient will be started on Coreg 3.125 mg twice a day with close outpatient follow-up.  Procedures:  1 unit packed red blood cells  2-D echo 08/04/2016  CT angiogram chest 08/04/2016  Lower extremity Doppler 08/03/2016  Consultations:  None  Discharge Exam: Vitals:   08/04/16 0423 08/04/16 1440  BP: (!) 138/55 (!) 177/65   Pulse: 72 75  Resp: 19 20  Temp: 98.7 F (37.1 C) 98.1 F (36.7 C)    General: NAD Cardiovascular: RRR Respiratory: CTAB Le: ERYTHEMA R > L, SOME WARMTH.  Discharge Instructions   Discharge Instructions    Diet - low sodium heart healthy    Complete by:  As directed    Increase activity slowly    Complete by:  As directed      Current Discharge Medication List    START taking these medications   Details  carvedilol (COREG) 3.125 MG tablet Take 1 tablet (3.125 mg total) by mouth 2 (two) times daily with a meal. Qty: 60 tablet, Refills: 2    doxycycline (VIBRA-TABS) 100 MG tablet Take 1 tablet (100 mg total) by mouth every 12 (twelve) hours. Take for 1 week then stop. Qty: 14 tablet, Refills: 0    iron polysaccharides (NU-IRON) 150 MG capsule Take 1 capsule (150 mg total) by mouth daily. Qty: 30 capsule, Refills: 0      CONTINUE these medications which have NOT CHANGED   Details  acidophilus (RISAQUAD) CAPS capsule Take 1 capsule by mouth daily.    calcium carbonate (TUMS - DOSED IN MG ELEMENTAL CALCIUM) 500 MG chewable tablet Chew 2 tablets by mouth daily.    esomeprazole (NEXIUM) 40 MG capsule Take 40 mg by mouth daily at 12 noon.    glimepiride (AMARYL) 4 MG tablet Take 4 mg by mouth daily with breakfast.    Menthol, Topical Analgesic, (BIOFREEZE EX) Apply 1 application topically daily as needed (pain).    Multiple Vitamins-Minerals (ICAPS MV PO) Take 1 tablet by mouth daily.    polyethylene glycol (MIRALAX / GLYCOLAX) packet Take 17 g by mouth daily as needed for mild constipation.    ranitidine (ZANTAC) 150 MG tablet Take 150 mg by mouth at bedtime.    sitaGLIPtin-metformin (JANUMET) 50-1000 MG tablet Take 1 tablet by mouth 2 (two) times daily with a meal.    sodium chloride (OCEAN) 0.65 % SOLN nasal spray Place 2 sprays into both nostrils as needed for congestion.    Wheat Dextrin (BENEFIBER PO) Take 1-2 tablets by mouth daily as needed (constipation).        Allergies  Allergen Reactions  . Ampicillin Rash  . Penicillins Rash    Has patient had a PCN reaction causing immediate rash, facial/tongue/throat swelling, SOB or lightheadedness with hypotension: Yes Has patient had a PCN reaction causing severe rash involving mucus membranes or skin necrosis: No Has patient had a PCN reaction that required hospitalization: No Has patient had a PCN reaction occurring within the last 10 years: No If all of the above answers are "NO", then may proceed with Cephalosporin use.    Follow-up Information    pcp. Schedule an appointment as soon as possible for a visit in 2 week(s).   Why:  f/u with PCP in 2-3 weeks.           The results of significant diagnostics from this hospitalization (including imaging, microbiology, ancillary and laboratory) are listed below for reference.    Significant Diagnostic Studies: Dg Chest 2 View  Result Date: 08/02/2016 CLINICAL DATA:  Shortness of Breath EXAM: CHEST  2 VIEW COMPARISON:  08/02/2016 FINDINGS: Cardiac shadow is stable in appearance. Aortic calcifications are again seen. Lungs are well aerated bilaterally. A vague nodular density is noted in the  right upper lobe similar to that seen on chest x-ray obtained 4 hours previous. No acute bony abnormality is seen. Stable lower thoracic compression deformity is noted. IMPRESSION: Stable nodular changes in the right upper lobe similar to that seen 4 hours previous. CT is again recommended for further evaluation. Electronically Signed   By: Alcide CleverMark  Lukens M.D.   On: 08/02/2016 22:23   Ct Angio Chest Pe W Or Wo Contrast  Result Date: 08/04/2016 CLINICAL DATA:  Persistent BILATERAL lower extremity edema worsening over past 2 weeks with associated exertional shortness of breath, leg cramps, decreased appetite, history type II diabetes mellitus, anemia EXAM: CT ANGIOGRAPHY CHEST WITH CONTRAST TECHNIQUE: Multidetector CT imaging of the chest was performed using the  standard protocol during bolus administration of intravenous contrast. Multiplanar CT image reconstructions and MIPs were obtained to evaluate the vascular anatomy. CONTRAST:  100 cc Isovue 370 IV COMPARISON:  None FINDINGS: Cardiovascular: Atherosclerotic calcifications aorta without aneurysm or dissection. No pericardial effusion. Heart unremarkable. Pulmonary arteries well opacified and patent. No evidence of pulmonary embolism. Mediastinum/Nodes: Base of cervical region unremarkable. No thoracic adenopathy. Esophagus unremarkable. Lungs/Pleura: Calcified granuloma RIGHT lower lobe image 52. Minimal dependent atelectasis. No acute infiltrate, pleural effusion or pneumothorax. Upper Abdomen: Visualized upper abdomen unremarkable Musculoskeletal: Diffuse osseous demineralization. Compression fractures at T9 and L1 with significant anterior height losses. Additional minimal superior endplate compression fracture at T8 vertebral body. Review of the MIP images confirms the above findings. IMPRESSION: No evidence of pulmonary embolism. No acute intrathoracic abnormalities. Osseous demineralization with significant compression fractures of T9 and L1, and minimally of superior endplate of T8. Aortic Atherosclerosis (ICD10-I70.0). Electronically Signed   By: Ulyses SouthwardMark  Boles M.D.   On: 08/04/2016 11:21    Microbiology: Recent Results (from the past 240 hour(s))  Culture, Urine     Status: Abnormal (Preliminary result)   Collection Time: 08/02/16 10:30 PM  Result Value Ref Range Status   Specimen Description URINE, RANDOM  Final   Special Requests NONE  Final   Culture >=100,000 COLONIES/mL ESCHERICHIA COLI (A)  Final   Report Status PENDING  Incomplete     Labs: Basic Metabolic Panel:  Recent Labs Lab 08/02/16 2151 08/04/16 0438  NA 133* 137  K 3.8 3.8  CL 100* 103  CO2 21* 25  GLUCOSE 163* 154*  BUN 19 15  CREATININE 0.70 0.70  CALCIUM 9.5 9.3  MG  --  1.8   Liver Function Tests:  Recent  Labs Lab 08/02/16 2151  AST 22  ALT 17  ALKPHOS 69  BILITOT 0.4  PROT 6.8  ALBUMIN 3.9   No results for input(s): LIPASE, AMYLASE in the last 168 hours. No results for input(s): AMMONIA in the last 168 hours. CBC:  Recent Labs Lab 08/02/16 2151 08/03/16 0820 08/04/16 0438  WBC 6.5 5.7 6.2  NEUTROABS 3.9 3.6  --   HGB 7.1* 8.7* 8.2*  HCT 23.3* 27.1* 25.6*  MCV 68.9* 70.0* 68.8*  PLT 260 249 200   Cardiac Enzymes: No results for input(s): CKTOTAL, CKMB, CKMBINDEX, TROPONINI in the last 168 hours. BNP: BNP (last 3 results)  Recent Labs  08/02/16 2151 08/03/16 0815  BNP 52.3 94.4    ProBNP (last 3 results) No results for input(s): PROBNP in the last 8760 hours.  CBG:  Recent Labs Lab 08/03/16 1202 08/03/16 1709 08/03/16 2109 08/04/16 0811 08/04/16 1223  GLUCAP 144* 182* 125* 183* 185*       Signed:  Gaelan Glennon MD.  [facility] 08/04/2016, 4:43  PM   

## 2016-08-05 LAB — URINE CULTURE

## 2018-01-09 IMAGING — CR DG CHEST 2V
2 series · 2 of 2 positions shown · non-contrast
Comparison: 08/02/2016

CLINICAL DATA: Shortness of Breath

EXAM:
CHEST  2 VIEW

[w chest pa]
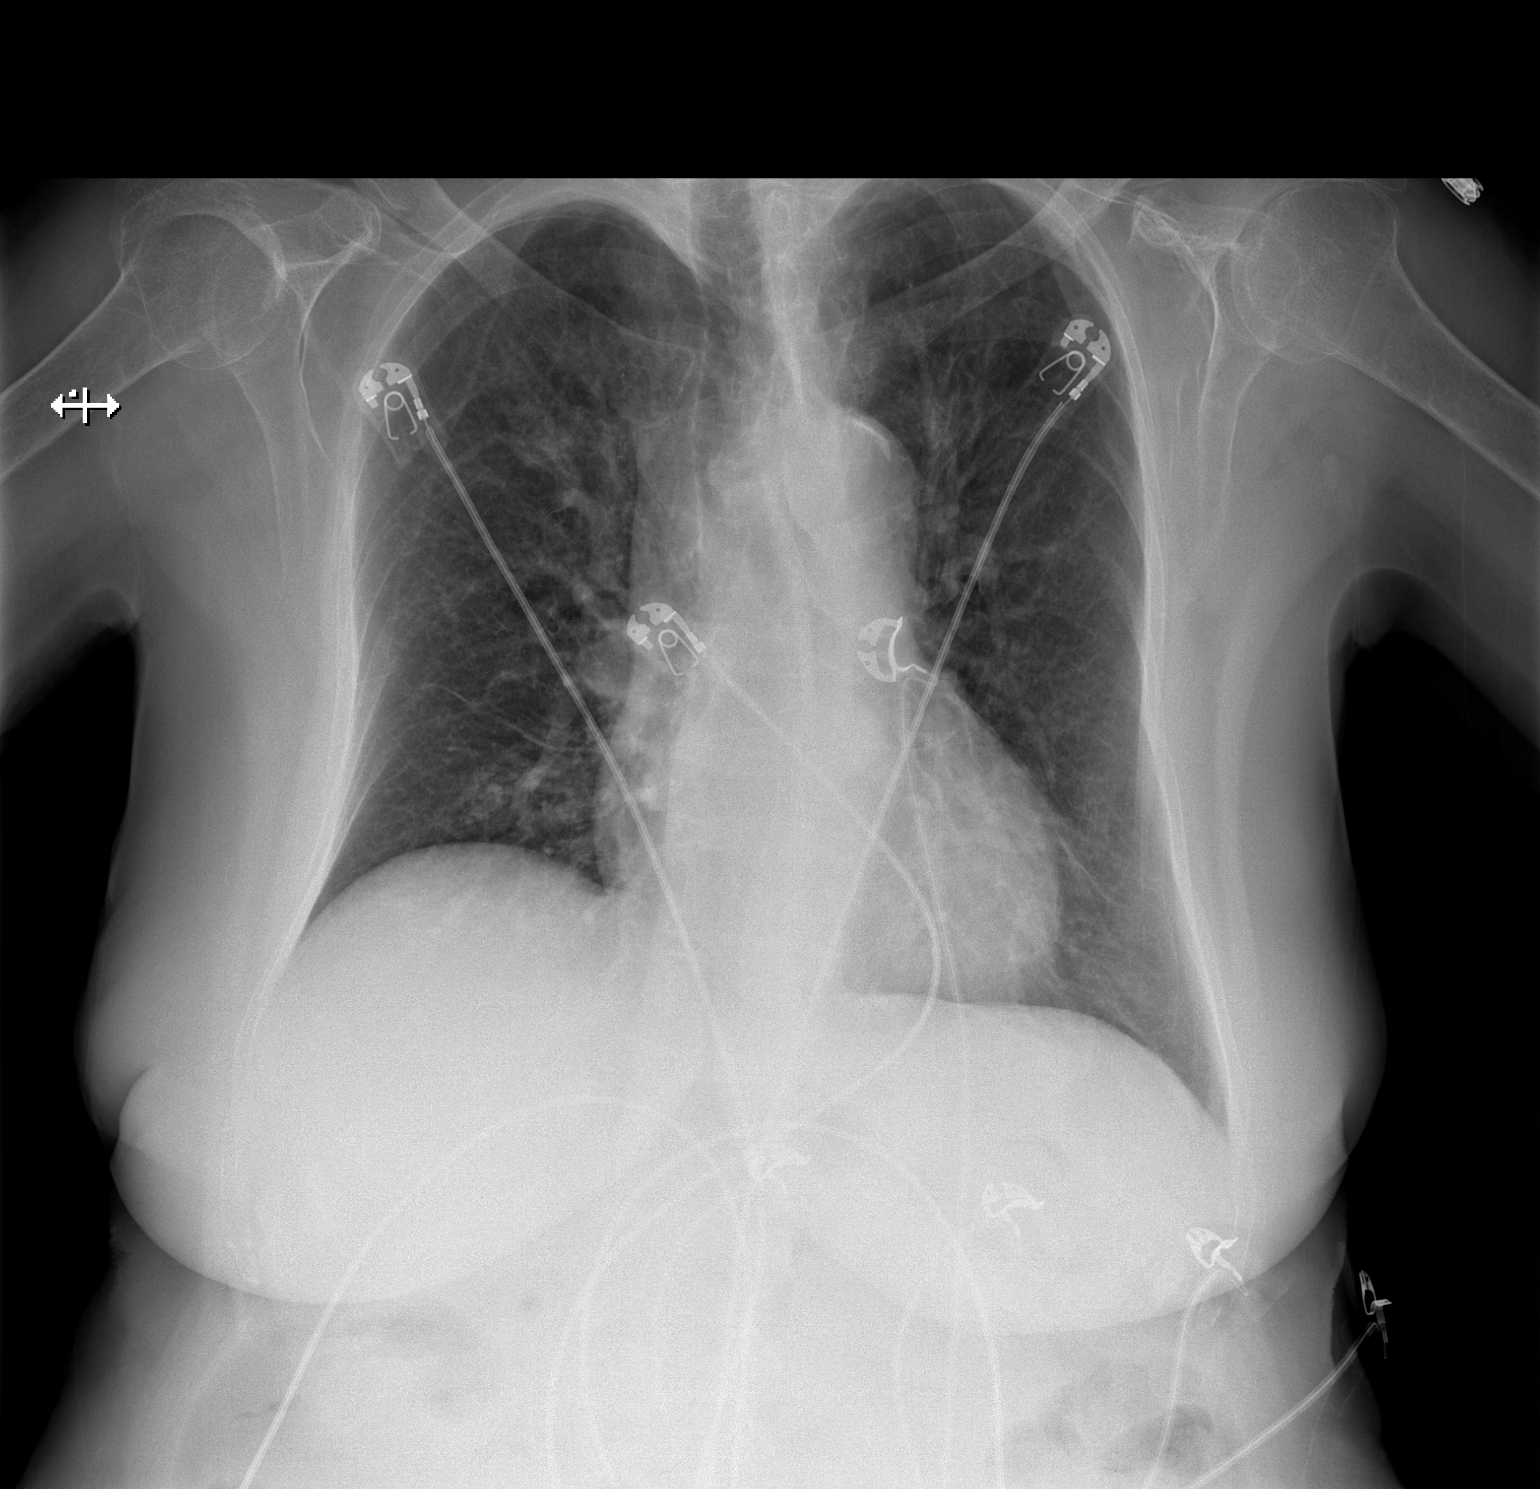

[w chest lat]
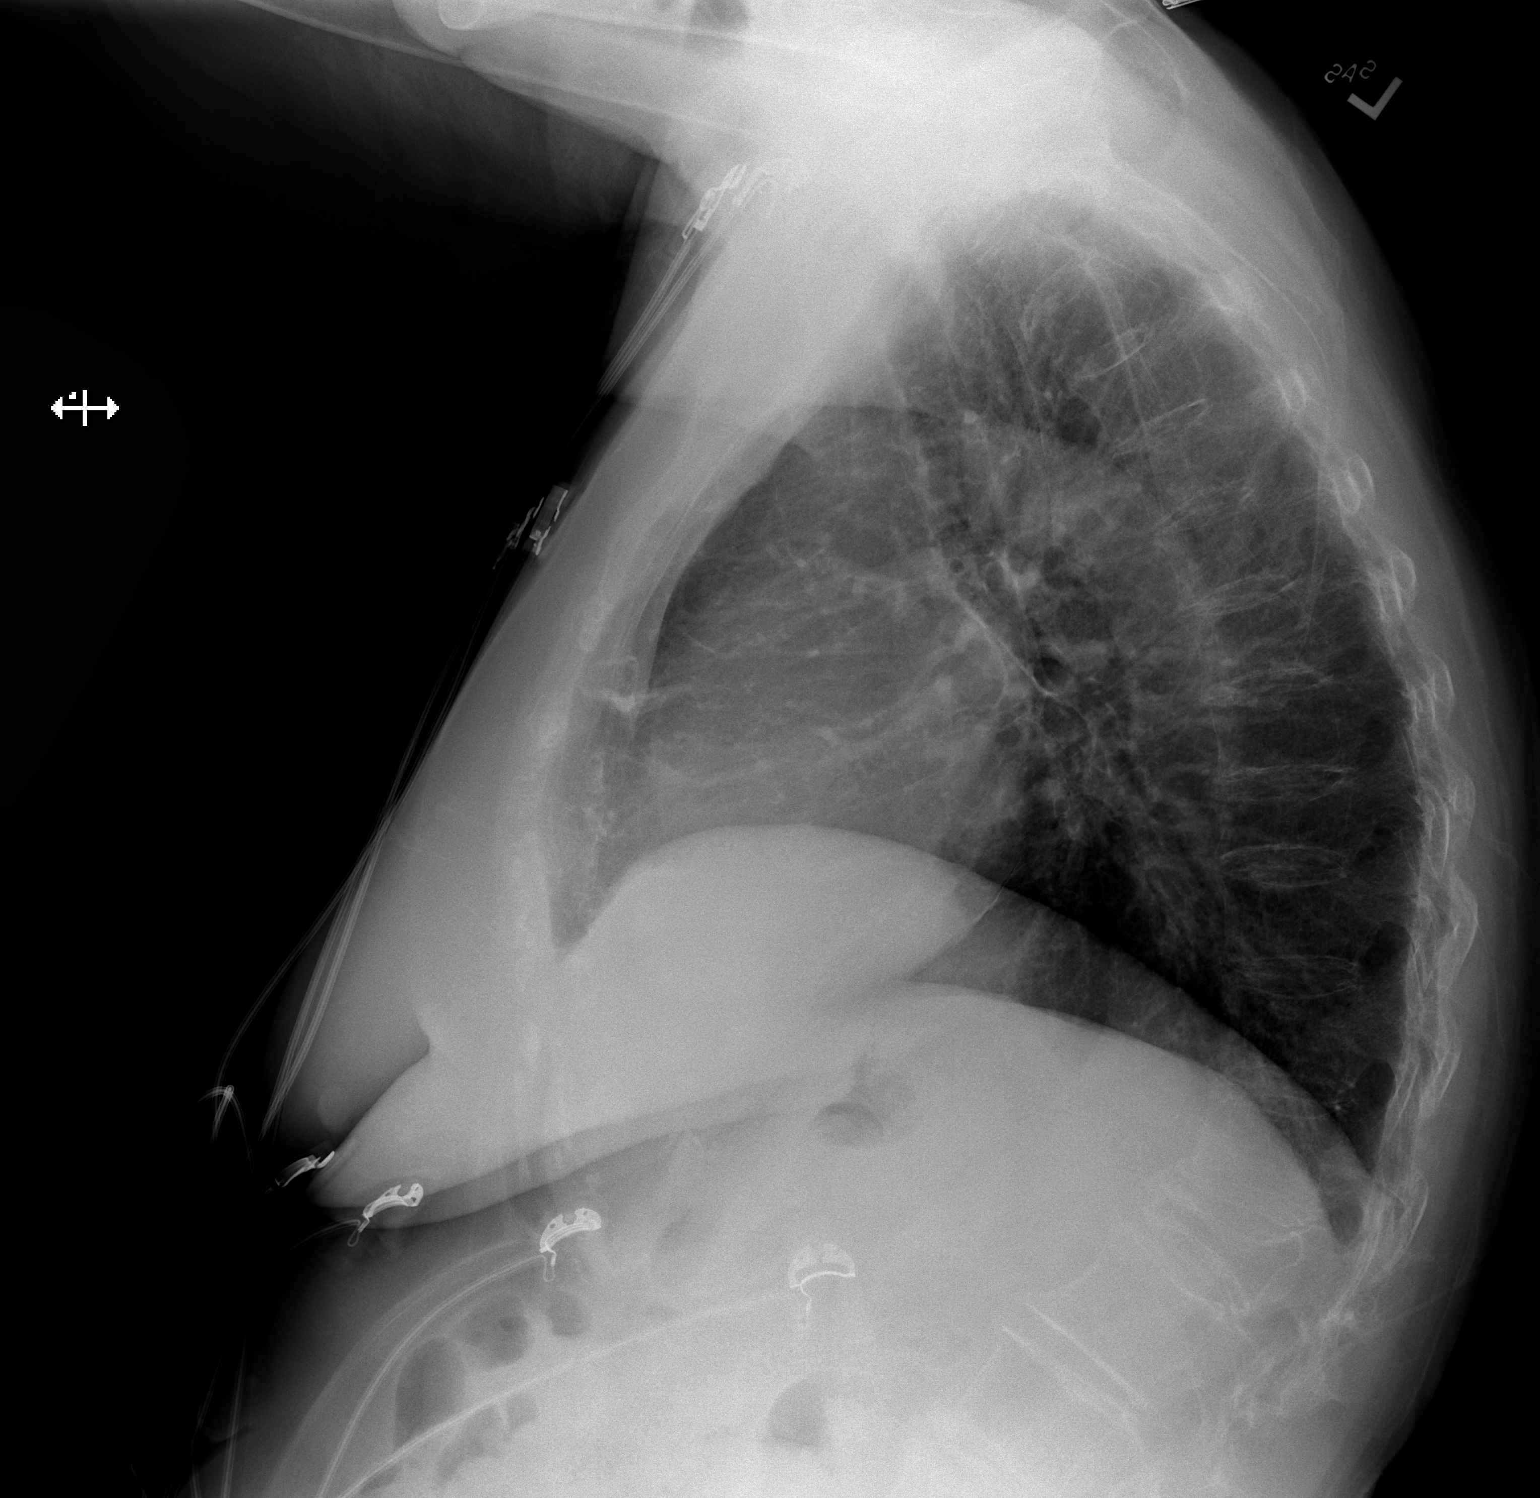

[2 of 2 positions shown; findings below may reference images not displayed]

FINDINGS: Cardiac shadow is stable in appearance. Aortic calcifications are
again seen. Lungs are well aerated bilaterally. A vague nodular
density is noted in the right upper lobe similar to that seen on
chest x-ray obtained 4 hours previous. No acute bony abnormality is
seen. Stable lower thoracic compression deformity is noted.
IMPRESSION: Stable nodular changes in the right upper lobe similar to that seen
4 hours previous. CT is again recommended for further evaluation.

## 2018-01-11 IMAGING — CT CT ANGIO CHEST
2 of 7 series · 18 of 46 positions shown · IV contrast (APPLIED)
Comparison: None

ADDENDUM:
I just spoke by telephone with Dr. Bardales who was requesting
clarification over the right upper lobe "vague nodular density"
described on chest x-ray of 08/02/2016 and whether or not this
lesion is present on the chest CT reported below. I have reviewed
this study in a focused fashion, assessing lung windows pertaining
to the right upper lobe. There is no right upper lobe lesion as
suggested on the chest x-ray performed 08/02/2016. Findings on the
chest x-ray likely related to superimposition of the anterior first
right rib end and pulmonary vascularity.
CLINICAL DATA: Persistent BILATERAL lower extremity edema worsening
over past 2 weeks with associated exertional shortness of breath,
leg cramps, decreased appetite, history type II diabetes mellitus,
anemia

EXAM:
CT ANGIOGRAPHY CHEST WITH CONTRAST
TECHNIQUE: Multidetector CT imaging of the chest was performed using the
standard protocol during bolus administration of intravenous
contrast. Multiplanar CT image reconstructions and MIPs were
obtained to evaluate the vascular anatomy.
CONTRAST:  100 cc Isovue 370 IV

[Series 5: thins · axial · 0.74mm/px · z∈[+1295,+1548]mm · 16 of 287 slices shown]
[im 17/287  lung]
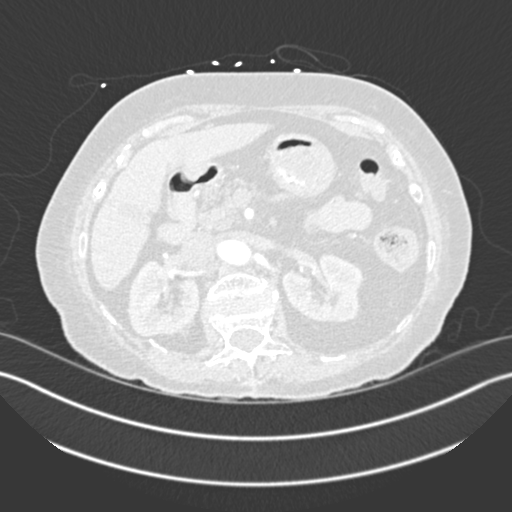
[im 34/287  soft-tissue]
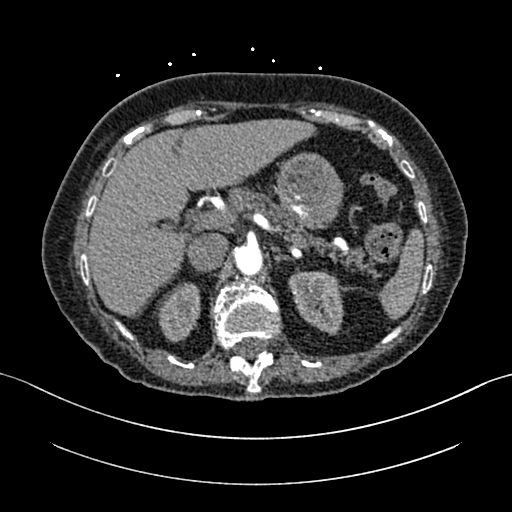
[im 51/287  lung]
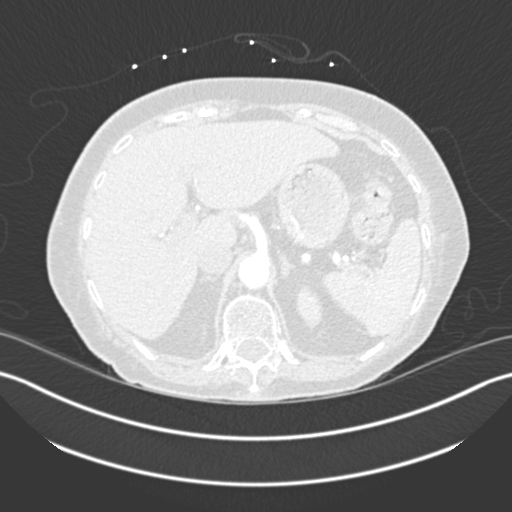
[im 68/287  soft-tissue]
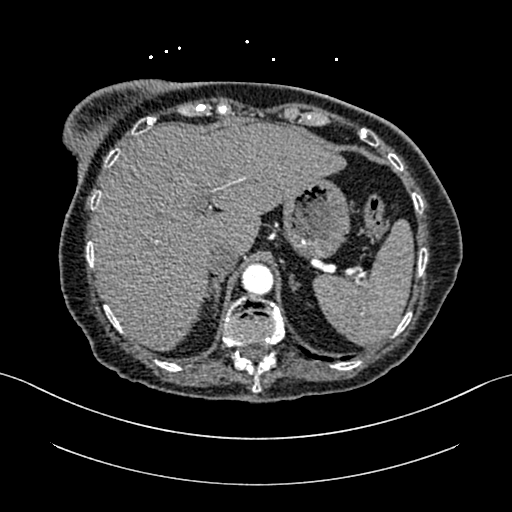
[im 85/287  lung]
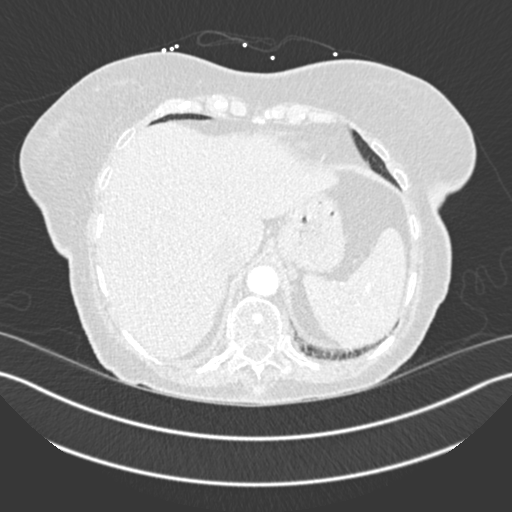
[im 101/287  soft-tissue]
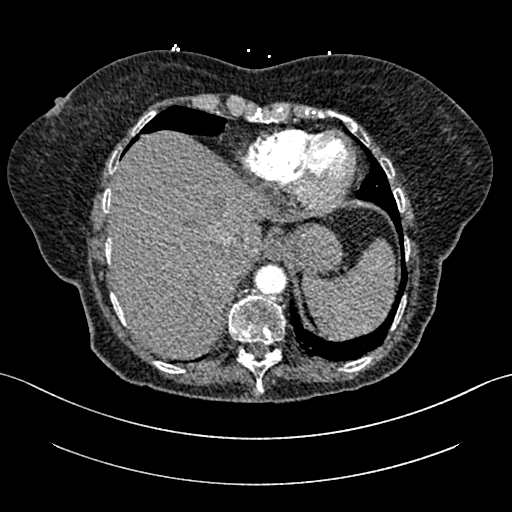
[im 118/287  lung]
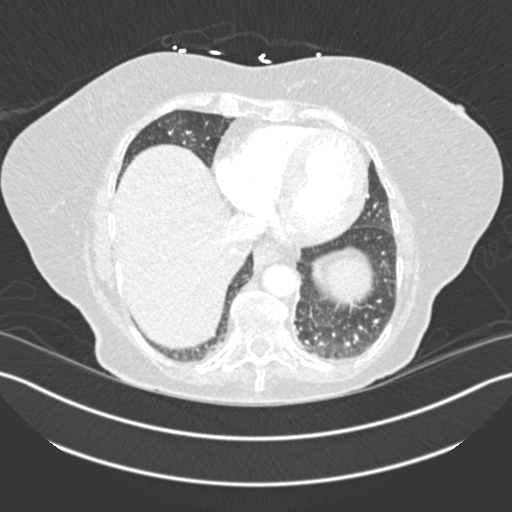
[im 135/287  soft-tissue]
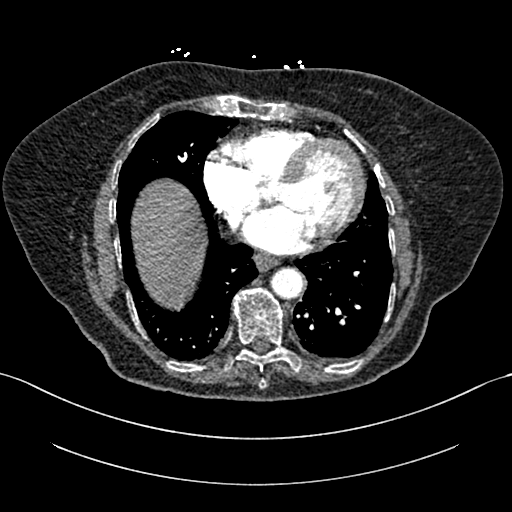
[im 152/287  lung]
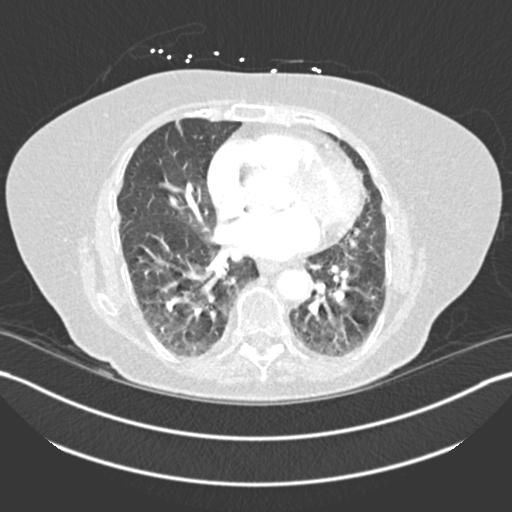
[im 169/287  soft-tissue]
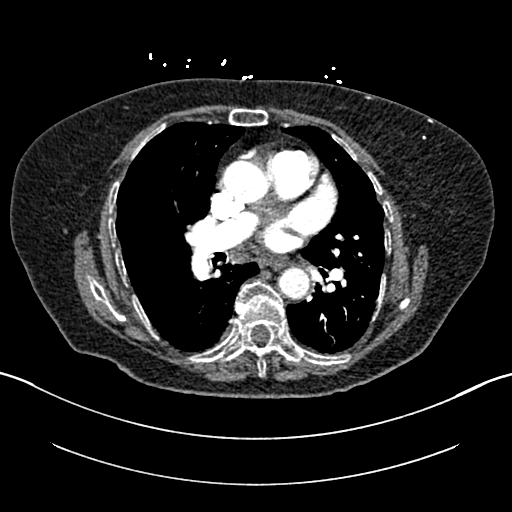
[im 186/287  lung]
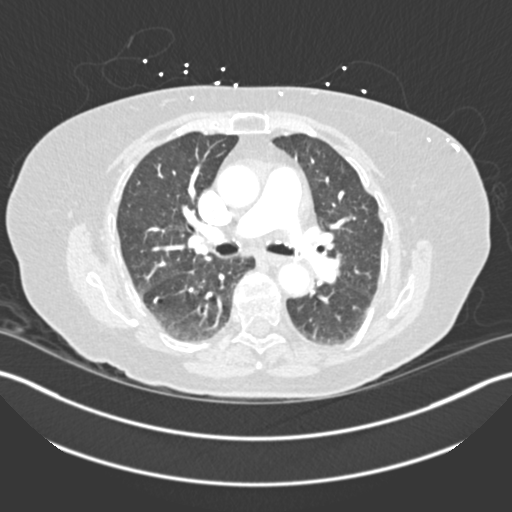
[im 202/287  soft-tissue]
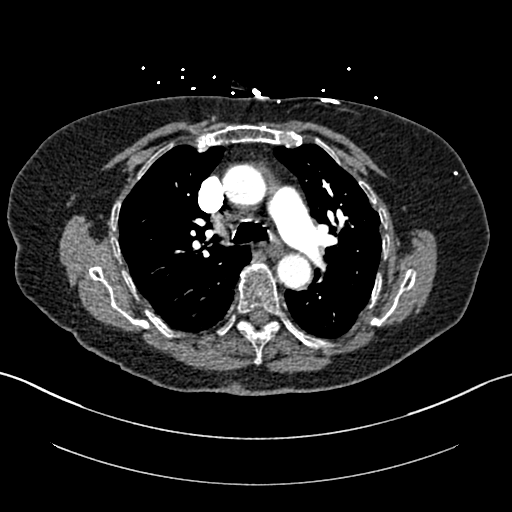
[im 219/287  lung]
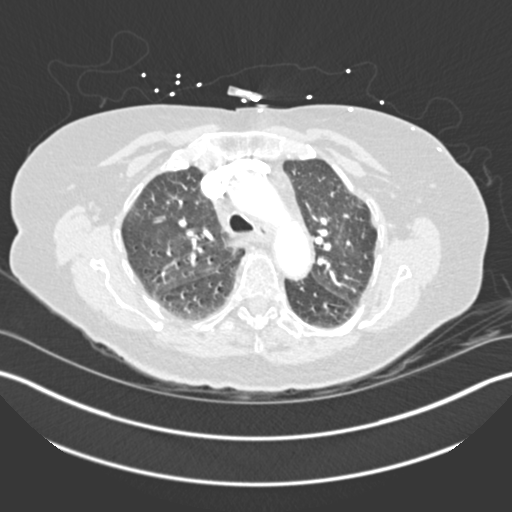
[im 236/287  soft-tissue]
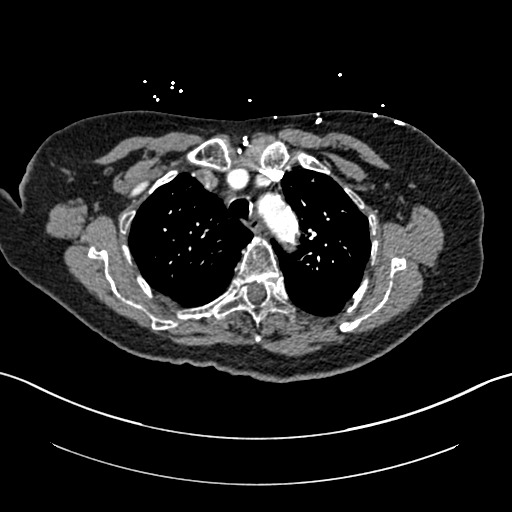
[im 253/287  lung]
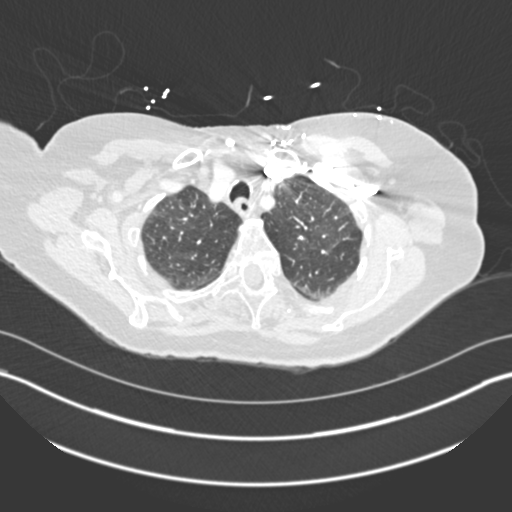
[im 270/287  soft-tissue]
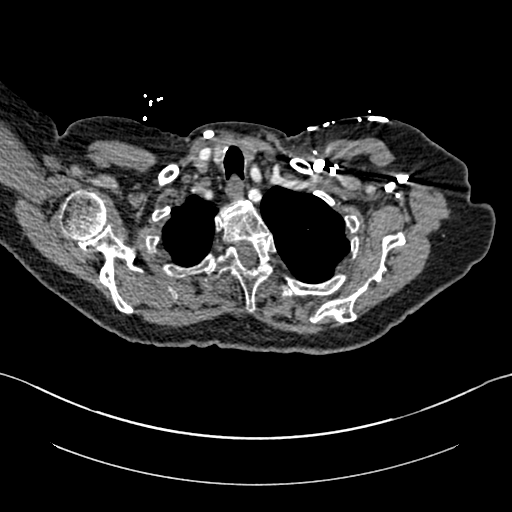

[Series 7: coronal mpr · coronal · 0.56mm/px · 2 of 106 slices shown]
[im 36/106  soft-tissue]
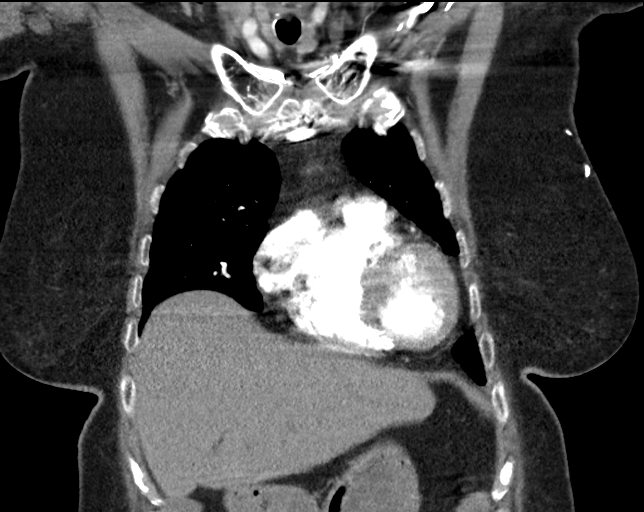
[im 71/106  soft-tissue]
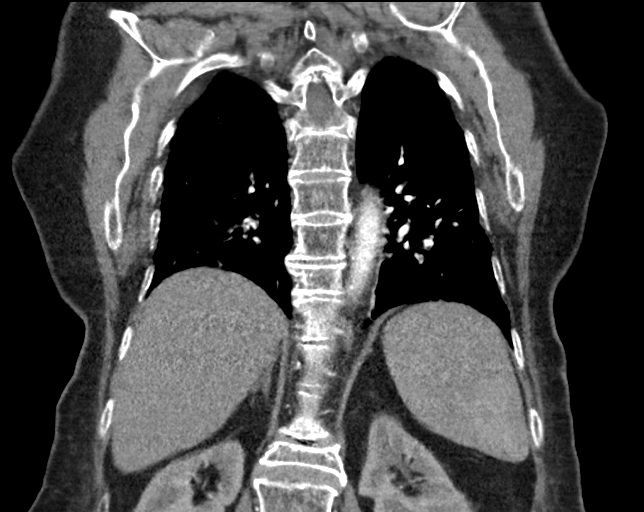

[18 of 46 positions shown; findings below may reference images not displayed]

FINDINGS: Cardiovascular: Atherosclerotic calcifications aorta without
aneurysm or dissection. No pericardial effusion. Heart unremarkable.
Pulmonary arteries well opacified and patent. No evidence of
pulmonary embolism.

Mediastinum/Nodes: Base of cervical region unremarkable. No thoracic
adenopathy. Esophagus unremarkable.

Lungs/Pleura: Calcified granuloma RIGHT lower lobe image 52. Minimal
dependent atelectasis. No acute infiltrate, pleural effusion or
pneumothorax.

Upper Abdomen: Visualized upper abdomen unremarkable

Musculoskeletal: Diffuse osseous demineralization. Compression
fractures at T9 and L1 with significant anterior height losses.
Additional minimal superior endplate compression fracture at T8
vertebral body.

Review of the MIP images confirms the above findings.
IMPRESSION: No evidence of pulmonary embolism.

No acute intrathoracic abnormalities.

Osseous demineralization with significant compression fractures of
T9 and L1, and minimally of superior endplate of T8.

Aortic Atherosclerosis ([id number]-SU0.0).

## 2019-03-21 ENCOUNTER — Encounter: Payer: Self-pay | Admitting: Endocrinology

## 2019-04-17 DIAGNOSIS — H3530 Unspecified macular degeneration: Secondary | ICD-10-CM | POA: Diagnosis not present

## 2019-04-17 DIAGNOSIS — E1140 Type 2 diabetes mellitus with diabetic neuropathy, unspecified: Secondary | ICD-10-CM | POA: Diagnosis not present

## 2019-04-17 DIAGNOSIS — K2970 Gastritis, unspecified, without bleeding: Secondary | ICD-10-CM | POA: Diagnosis not present

## 2019-04-17 DIAGNOSIS — K219 Gastro-esophageal reflux disease without esophagitis: Secondary | ICD-10-CM | POA: Diagnosis not present

## 2019-04-17 DIAGNOSIS — N390 Urinary tract infection, site not specified: Secondary | ICD-10-CM | POA: Diagnosis not present

## 2019-04-17 DIAGNOSIS — D509 Iron deficiency anemia, unspecified: Secondary | ICD-10-CM | POA: Diagnosis not present

## 2019-04-17 DIAGNOSIS — I10 Essential (primary) hypertension: Secondary | ICD-10-CM | POA: Diagnosis not present

## 2019-04-17 DIAGNOSIS — R32 Unspecified urinary incontinence: Secondary | ICD-10-CM | POA: Diagnosis not present

## 2019-04-17 DIAGNOSIS — I352 Nonrheumatic aortic (valve) stenosis with insufficiency: Secondary | ICD-10-CM | POA: Diagnosis not present

## 2019-04-17 DIAGNOSIS — H548 Legal blindness, as defined in USA: Secondary | ICD-10-CM | POA: Diagnosis not present

## 2019-04-17 DIAGNOSIS — M5432 Sciatica, left side: Secondary | ICD-10-CM | POA: Diagnosis not present

## 2019-04-17 DIAGNOSIS — M1990 Unspecified osteoarthritis, unspecified site: Secondary | ICD-10-CM | POA: Diagnosis not present

## 2019-04-17 DIAGNOSIS — K5900 Constipation, unspecified: Secondary | ICD-10-CM | POA: Diagnosis not present

## 2019-04-17 DIAGNOSIS — M8008XD Age-related osteoporosis with current pathological fracture, vertebra(e), subsequent encounter for fracture with routine healing: Secondary | ICD-10-CM | POA: Diagnosis not present

## 2019-04-30 ENCOUNTER — Other Ambulatory Visit: Payer: Self-pay

## 2019-04-30 ENCOUNTER — Encounter (INDEPENDENT_AMBULATORY_CARE_PROVIDER_SITE_OTHER): Payer: Self-pay

## 2019-04-30 ENCOUNTER — Ambulatory Visit (INDEPENDENT_AMBULATORY_CARE_PROVIDER_SITE_OTHER): Payer: Medicare Other | Admitting: Nurse Practitioner

## 2019-04-30 ENCOUNTER — Encounter: Payer: Self-pay | Admitting: Nurse Practitioner

## 2019-04-30 VITALS — BP 142/70 | HR 81 | Temp 97.1°F | Ht 60.0 in | Wt 122.6 lb

## 2019-04-30 DIAGNOSIS — I10 Essential (primary) hypertension: Secondary | ICD-10-CM

## 2019-04-30 DIAGNOSIS — I350 Nonrheumatic aortic (valve) stenosis: Secondary | ICD-10-CM | POA: Diagnosis not present

## 2019-04-30 DIAGNOSIS — E213 Hyperparathyroidism, unspecified: Secondary | ICD-10-CM

## 2019-04-30 DIAGNOSIS — Z7189 Other specified counseling: Secondary | ICD-10-CM | POA: Diagnosis not present

## 2019-04-30 DIAGNOSIS — E1142 Type 2 diabetes mellitus with diabetic polyneuropathy: Secondary | ICD-10-CM

## 2019-04-30 DIAGNOSIS — M810 Age-related osteoporosis without current pathological fracture: Secondary | ICD-10-CM

## 2019-04-30 DIAGNOSIS — K219 Gastro-esophageal reflux disease without esophagitis: Secondary | ICD-10-CM

## 2019-04-30 DIAGNOSIS — K5904 Chronic idiopathic constipation: Secondary | ICD-10-CM

## 2019-04-30 MED ORDER — ONETOUCH ULTRASOFT LANCETS MISC: 12 refills | Status: DC

## 2019-04-30 MED ORDER — GLUCOSE BLOOD VI STRP: ORAL_STRIP | 12 refills | Status: DC

## 2019-04-30 MED ORDER — ONETOUCH ULTRA 2 W/DEVICE KIT: 1.0000 | PACK | Freq: Two times a day (BID) | 0 refills | Status: DC

## 2019-04-30 NOTE — Patient Instructions (Addendum)
To come in for fasting blood work this week.   Make sure to sign medical record release  Follow up in 6 weeks for new patient follow up

## 2019-04-30 NOTE — Progress Notes (Signed)
Careteam: Patient Care Team: System, Pcp Not In as PCP - General  PLACE OF SERVICE:  [place]  Advanced Directive information Does Patient Have a Medical Advance Directive?: Yes, Type of Advance Directive: [place];Living will, Does patient want to make changes to medical advance directive?: No - Patient declined  Allergies  Allergen Reactions  . Ampicillin Rash  . Penicillins Rash    Has patient had a PCN reaction causing immediate rash, facial/tongue/throat swelling, SOB or lightheadedness with hypotension: Yes Has patient had a PCN reaction causing severe rash involving mucus membranes or skin necrosis: No Has patient had a PCN reaction that required hospitalization: No Has patient had a PCN reaction occurring within the last 10 years: No If all of the above answers are "NO", then may proceed with Cephalosporin use.   . Sulfur Rash    Chief Complaint  Patient presents with  . Establish Care    New patient establish care. Here with daughter Ilona Sorrel   . Medication Management    Did not have pill bottles at initial appointment.   . Medication Management    Discuss how much iron patient should take  . Medication Management    Discuss if patient should be on Miralax  . Medication Management    Discuss the use of premarin cream   . FYI    Patient is getting OT from [facility]. Pending appt with Dr.Groat.   Marland Kitchen Referral    Referral request to GI, Endocrinology, Cardiology, and GYN      HPI: Patient is a 86 y.o. female to establish care Pt was living by herself but her daughter moved her to be with her. She was no longer be able to be by herself due to falls.  Her doctor said she could not be by her self.  She had some home health aid- skilled nurse and PT with her.  The skilled nurse went over her medication with her daughter and that is what she is giving her.   Hypertension- generally "very good" sbp 123-136. On amlodipine 2.5 mg daily,  carvedilol 3.125 mg twice daily,   Vaginal atrophy/dysuria- premarin vaginal cream prescribed- has not used for over a month.   Low sodium- was on a restricted diet- but this was short term  Constipation- controlled on colace 100 mg twice daily, usually uses miralax twice daily  Iron def anemia- on iron supplement daily, recently with another hospitalization due to low hgb 7, she received transfusion.   DM- taking metformin 500 mg BID, januvia 100 mg and glimepiride 4 mg twice daily. Fasting blood sugars ranging from [phone], in the PM 112-193.   Reports neuropathy in hands and feet.  Has macular degeneration (appt scheduled with Dr Katy Fitch)  One touch strips and lancets.   GERD- on protonix 40 mg daily- has ongoing issues with this. Taking tums like candy- and she was told to stop taking tums.  Burning in chest and throat.   Left knee OA- used to get cortisone shot. Now using exercise to help this and Aspercreme. Uses Advil every night   Osteoporosis- not on treatment, multiple compound fractures.  Reports she does want any treatment regarding this.  Hyerpercalcemia/disorder of phosphorus metabolism- was taking too many tums so she was taking too much of that, now on phosphorus supplement.    Review of Systems:  Review of Systems  Constitutional: Positive for weight loss. Negative for chills and fever.  HENT: Positive for congestion.  Negative for tinnitus.   Respiratory: Negative for cough, sputum production and shortness of breath.   Cardiovascular: Negative for chest pain, palpitations and leg swelling.  Gastrointestinal: Negative for abdominal pain, constipation, diarrhea and heartburn.  Genitourinary: Positive for frequency. Negative for dysuria and urgency.  Musculoskeletal: Positive for back pain and joint pain. Negative for falls and myalgias.  Skin: Negative.   Neurological: Negative for dizziness and headaches.  Psychiatric/Behavioral: Negative for depression and  memory loss. The patient does not have insomnia.     Past Medical History:  Diagnosis Date  . Anemia   . Aortic valve stenosis, moderate: Per 2-D echo 08/04/2016 08/04/2016  . Blindness    Per [facility] New Patient Packet   . Diabetes mellitus without complication ([place])   . Disorder of phosphorus metabolism    Per [facility] New Patient Packet   . High blood pressure    Per [facility] Patient Packet   . Hypercalcemia    Per [facility] New Patient Packet   . Macular degeneration   . Non-rheumatic aortic stenosis    Per [facility] New Patient Packet   . UTI (urinary tract infection)    Per [facility] New Patient Packet    Past Surgical History:  Procedure Laterality Date  . APPENDECTOMY  1953   Per [facility] New Patient Packet   . COLONOSCOPY     Per [facility] New Patient Packet   . TONSILLECTOMY  1939   Per [facility] New Patient Packet    Social History:   reports that she has never smoked. She has never used smokeless tobacco. She reports current alcohol use. She reports that she does not use drugs.  Family History  Problem Relation Age of Onset  . Psoriasis Father        Of the liver   . Osteoporosis Sister   . Lung cancer Brother   . Osteoporosis Sister     Medications: Patient's Medications  New Prescriptions   No medications on file  Previous Medications   ACETAMINOPHEN (TYLENOL) 325 MG TABLET    Take 650 mg by mouth as needed.    AMLODIPINE (NORVASC) 2.5 MG TABLET    Take 2.5 mg by mouth daily.   CARVEDILOL (COREG) 3.125 MG TABLET    Take 1 tablet (3.125 mg total) by mouth 2 (two) times daily with a meal.   CONJUGATED ESTROGENS (PREMARIN) VAGINAL CREAM    Place 1 Applicatorful vaginally 2 (two) times a week.   DOCUSATE SODIUM (COLACE) 100 MG CAPSULE    Take 100 mg by mouth 2 (two) times daily.   FERROUS SULFATE 325 (65 FE) MG EC TABLET    Take 325 mg by mouth in the morning and at bedtime.    GLIMEPIRIDE (AMARYL) 4 MG TABLET    Take 4 mg by mouth in the morning and at bedtime.    METFORMIN (GLUCOPHAGE) 500 MG  TABLET    Take 500 mg by mouth 2 (two) times daily with a meal.   MULTIPLE VITAMINS-MINERALS (ICAPS MV PO)    Take 2 tablets by mouth daily.    NAPROXEN SODIUM (ALEVE) 220 MG TABLET    Take 220 mg by mouth 2 (two) times daily as needed.   PANTOPRAZOLE (PROTONIX) 40 MG TABLET    Take 40 mg by mouth daily.   PHOSPHORUS (K PHOS NEUTRAL) 155-852-130 MG TABLET    Take 250 mg by mouth 2 (two) times daily.   PSYLLIUM (METAMUCIL) 58.6 % PACKET    Take 1 packet  by mouth daily.   SALINE NASAL MIST NA    Place 1 spray into the nose daily.   SITAGLIPTIN (JANUVIA) 100 MG TABLET    Take 100 mg by mouth daily.  Modified Medications   No medications on file  Discontinued Medications   ATORVASTATIN (LIPITOR) 20 MG TABLET    Take 20 mg by mouth daily.   AZELASTINE-FLUTICASONE 137-50 MCG/ACT SUSP    Place 2 sprays into the nose in the morning and at bedtime.   CALCIUM ELEMENTAL AS CARBONATE (BARIATRIC TUMS ULTRA) 400 MG CHEWABLE TABLET    Chew 1,000 mg by mouth as needed.    CETIRIZINE (ZYRTEC) 10 MG TABLET    Take 10 mg by mouth daily.   POLYETHYLENE GLYCOL (MIRALAX / GLYCOLAX) PACKET    Take 17 g by mouth daily.    TRIAMCINOLONE (NASACORT ALLERGY 24HR) 55 MCG/ACT AERO NASAL INHALER    Place 2 sprays into the nose daily.    Physical Exam:  Vitals:   04/30/19 1337  BP: (!) 142/70  Pulse: 81  Temp: (!) 97.1 F (36.2 C)  TempSrc: Temporal  SpO2: 97%  Weight: 122 lb 9.6 oz (55.6 kg)  Height: 5' (1.524 m)   Body mass index is 23.94 kg/m. Wt Readings from Last 3 Encounters:  04/30/19 122 lb 9.6 oz (55.6 kg)  08/03/16 149 lb 0.5 oz (67.6 kg)    Physical Exam Constitutional:      General: She is not in acute distress.    Appearance: She is well-developed. She is not diaphoretic.  HENT:     Head: Normocephalic and atraumatic.     Mouth/Throat:     Pharynx: No oropharyngeal exudate.  Eyes:     Conjunctiva/sclera: Conjunctivae normal.     Pupils: Pupils are equal, round, and reactive to light.    Cardiovascular:     Rate and Rhythm: Normal rate and regular rhythm.     Heart sounds: Normal heart sounds.  Pulmonary:     Effort: Pulmonary effort is normal.     Breath sounds: Normal breath sounds.  Abdominal:     General: Bowel sounds are normal.     Palpations: Abdomen is soft.  Musculoskeletal:        General: No tenderness.     Cervical back: Normal range of motion and neck supple.  Skin:    General: Skin is warm and dry.  Neurological:     Mental Status: She is alert and oriented to person, place, and time.     Labs reviewed: Basic Metabolic Panel: No results for input(s): NA, K, CL, CO2, GLUCOSE, BUN, CREATININE, CALCIUM, MG, PHOS, TSH in the last 8760 hours. Liver Function Tests: No results for input(s): AST, ALT, ALKPHOS, BILITOT, PROT, ALBUMIN in the last 8760 hours. No results for input(s): LIPASE, AMYLASE in the last 8760 hours. No results for input(s): AMMONIA in the last 8760 hours. CBC: No results for input(s): WBC, NEUTROABS, HGB, HCT, MCV, PLT in the last 8760 hours. Lipid Panel: No results for input(s): CHOL, HDL, LDLCALC, TRIG, CHOLHDL, LDLDIRECT in the last 8760 hours. TSH: No results for input(s): TSH in the last 8760 hours. A1C: No results found for: HGBA1C   Assessment/Plan 1. Gastroesophageal reflux disease, unspecified whether esophagitis present -worsening symptoms recently, currently taking protonix 40 mg daily, can increase to BID, also strongly encouraged lifestyle modifications and diet changes.  - Ambulatory referral to Gastroenterology  2. Aortic valve stenosis, moderate: Per 2-D echo 08/04/2016 -stable, without symptoms of worsening  shortness of breath, chest pains at this time - Ambulatory referral to Cardiology to establish care  3. Hyperparathyroidism ([place]) -not by prior PCP, request for her to establish with endocrine once she moved to [facility]. Awaiting records - Ambulatory referral to Endocrinology - CMP with eGFR([facility]); Future -  CBC with Differential/Platelet; Future - TSH; Future  4. Advance care planning -discussed MOST form, copy given and will complete and next visit.  - DNR (Do Not Resuscitate)  5. Type 2 diabetes mellitus with diabetic polyneuropathy, without long-term current use of insulin (HCC) -reviewed blood sugar log, and blood sugars have not been well controlled. Will follow up labs but likely will need adjustments to medication as well as continuing lifestyle modifications.  - Blood Glucose Monitoring Suppl (ONE TOUCH ULTRA 2) w/Device KIT; 1 each by Does not apply route in the morning and at bedtime. E11.9  Dispense: 1 kit; Refill: 0 - Lancets (ONETOUCH ULTRASOFT) lancets; Check blood sugar twice daily DX E11.9  Dispense: 100 each; Refill: 12 - glucose blood test strip; Check blood sugar twice daily as directed E11.9  Dispense: 100 each; Refill: 12 - Hemoglobin A1c; Future - Lipid panel; Future  6. Osteoporosis, unspecified osteoporosis type, unspecified pathological fracture presence -declines treatment at this time.   7. Essential hypertension Will continue current regimen at this time with dietary modifications, will have her monitor BP at home, goal <140/90.   8. Constipation Worsen of symptoms, encouraged to add miralax daily with proper water intake.   Next appt: fasting labs this week, followed by 6 week follow up. Request for records.  Carlos American. [place] [phone]

## 2019-05-01 ENCOUNTER — Other Ambulatory Visit: Payer: Medicare Other

## 2019-05-01 ENCOUNTER — Encounter: Payer: Self-pay | Admitting: Gastroenterology

## 2019-05-01 ENCOUNTER — Telehealth: Payer: Self-pay | Admitting: *Deleted

## 2019-05-01 DIAGNOSIS — E1142 Type 2 diabetes mellitus with diabetic polyneuropathy: Secondary | ICD-10-CM

## 2019-05-01 DIAGNOSIS — E213 Hyperparathyroidism, unspecified: Secondary | ICD-10-CM | POA: Diagnosis not present

## 2019-05-01 NOTE — Telephone Encounter (Signed)
Patient and daughter came in today for labs and bought medications in. I have updated the med list. There is a question on how long pt should take the K PHOS 155-852-130, on the bottle it states " take 1 tablet by mouth 4 times daily for 30 days" the start of the medication was 03/12/2019, I left on med list until you decide.

## 2019-05-01 NOTE — Telephone Encounter (Signed)
Noted, awaiting records from previous PCP

## 2019-05-02 ENCOUNTER — Ambulatory Visit: Payer: Medicare Other | Admitting: Nurse Practitioner

## 2019-05-02 LAB — COMPLETE METABOLIC PANEL WITH GFR
AG Ratio: 1.5 (calc) (ref 1.0–2.5)
ALT: 12 U/L (ref 6–29)
AST: 14 U/L (ref 10–35)
Albumin: 4 g/dL (ref 3.6–5.1)
Alkaline phosphatase (APISO): 53 U/L (ref 37–153)
BUN: 20 mg/dL (ref 7–25)
CO2: 27 mmol/L (ref 20–32)
Calcium: 9.6 mg/dL (ref 8.6–10.4)
Chloride: 101 mmol/L (ref 98–110)
Creat: 0.81 mg/dL (ref 0.60–0.88)
GFR, Est African American: 74 mL/min/{1.73_m2} (ref >=60)
GFR, Est Non African American: 64 mL/min/{1.73_m2} (ref >=60)
Globulin: 2.6 g/dL (calc) (ref 1.9–3.7)
Glucose, Bld: 155 mg/dL — ABNORMAL HIGH (ref 65–99)
Potassium: 3.6 mmol/L (ref 3.5–5.3)
Sodium: 138 mmol/L (ref 135–146)
Total Bilirubin: 0.6 mg/dL (ref 0.2–1.2)
Total Protein: 6.6 g/dL (ref 6.1–8.1)

## 2019-05-02 LAB — CBC WITH DIFFERENTIAL/PLATELET
Absolute Monocytes: 568 cells/uL (ref 200–950)
Basophils Absolute: 20 cells/uL (ref 0–200)
Basophils Relative: 0.3 %
Eosinophils Absolute: 99 cells/uL (ref 15–500)
Eosinophils Relative: 1.5 %
HCT: 33.3 % — ABNORMAL LOW (ref 35.0–45.0)
Hemoglobin: 10.6 g/dL — ABNORMAL LOW (ref 11.7–15.5)
Lymphs Abs: 1465 cells/uL (ref 850–3900)
MCH: 27.7 pg (ref 27.0–33.0)
MCHC: 31.8 g/dL — ABNORMAL LOW (ref 32.0–36.0)
MCV: 87.2 fL (ref 80.0–100.0)
MPV: 12.3 fL (ref 7.5–12.5)
Monocytes Relative: 8.6 %
Neutro Abs: 4448 cells/uL (ref 1500–7800)
Neutrophils Relative %: 67.4 %
Platelets: 160 10*3/uL (ref 140–400)
RBC: 3.82 10*6/uL (ref 3.80–5.10)
RDW: 16.6 % — ABNORMAL HIGH (ref 11.0–15.0)
Total Lymphocyte: 22.2 %
WBC: 6.6 10*3/uL (ref 3.8–10.8)

## 2019-05-02 LAB — HEMOGLOBIN A1C
Hgb A1c MFr Bld: 6.6 % of total Hgb — ABNORMAL HIGH (ref <5.7)
Mean Plasma Glucose: 143 (calc)
eAG (mmol/L): 7.9 (calc)

## 2019-05-02 LAB — LIPID PANEL
Cholesterol: 147 mg/dL (ref <200)
HDL: 56 mg/dL (ref >=50)
LDL Cholesterol (Calc): 75 mg/dL (calc)
Non-HDL Cholesterol (Calc): 91 mg/dL (calc) (ref <130)
Total CHOL/HDL Ratio: 2.6 (calc) (ref <5.0)
Triglycerides: 84 mg/dL (ref <150)

## 2019-05-02 LAB — TSH: TSH: 1.75 mIU/L (ref 0.40–4.50)

## 2019-05-14 DIAGNOSIS — H353134 Nonexudative age-related macular degeneration, bilateral, advanced atrophic with subfoveal involvement: Secondary | ICD-10-CM | POA: Diagnosis not present

## 2019-05-14 DIAGNOSIS — Z961 Presence of intraocular lens: Secondary | ICD-10-CM | POA: Diagnosis not present

## 2019-05-14 LAB — HM DIABETES EYE EXAM

## 2019-05-16 ENCOUNTER — Encounter: Payer: Self-pay | Admitting: Physician Assistant

## 2019-05-16 ENCOUNTER — Ambulatory Visit: Payer: Medicare Other | Admitting: Physician Assistant

## 2019-05-16 VITALS — BP 116/68 | HR 90 | Temp 97.8°F | Ht 60.0 in | Wt 120.0 lb

## 2019-05-16 DIAGNOSIS — R110 Nausea: Secondary | ICD-10-CM

## 2019-05-16 DIAGNOSIS — K219 Gastro-esophageal reflux disease without esophagitis: Secondary | ICD-10-CM

## 2019-05-16 MED ORDER — PANTOPRAZOLE SODIUM 40 MG PO TBEC: 40.0000 mg | DELAYED_RELEASE_TABLET | Freq: Two times a day (BID) | ORAL | 3 refills | Status: DC

## 2019-05-16 NOTE — Patient Instructions (Addendum)
If you are age 84 or older, your body mass index should be between 23-30. Your Body mass index is 23.44 kg/m. If this is out of the aforementioned range listed, please consider follow up with your Primary Care Provider.  If you are age 65 or younger, your body mass index should be between 19-25. Your Body mass index is 23.44 kg/m. If this is out of the aformentioned range listed, please consider follow up with your Primary Care Provider.   Increase Pantoprazole 40 mg twice daily 30 minutes before breakfast and dinner.   Use Mylanta as needed in between Pantropazole.   Call back in 2 weeks with an update. Ask to speak with Hilma Favors, RN.

## 2019-05-16 NOTE — Progress Notes (Signed)
Chief Complaint: GERD  HPI:    Stacey Ritter is a 86 year old Caucasian female with a past medical history as listed below including aortic valve stenosis, who was referred to me by Sharon Seller, NP for a complaint of GERD.      Today, the patient presents to clinic accompanied by her daughter who does assist with her healthcare and she explains that over the past 2 months she has had uncontrolled reflux symptoms and constantly feels a burning in her chest that rises through her throat.  This is worse after eating just about anything.  Over the past week she has been using her Pantoprazole every time that she gets this burning or feels nauseous which is sometimes up to 3 or 4 times a day.  She never consistently schedules this out.  Prior to this was using Tums like candy and was told to discontinue as her calcium levels were very high.  Prior to all of these symptoms was using Ibuprofen at night for some back fractures to help with pain, she has stopped doing this over the past month per recommendations from PCP.    Previously had endoscopic work-up in [place] prior to moving down here by her daughter.    Denies fever, chills, weight loss, melena or reflux symptoms that awaken her from sleep.  Past Medical History:  Diagnosis Date  . Anemia   . Aortic valve stenosis, moderate: Per 2-D echo 08/04/2016 08/04/2016  . Blindness    Per [facility] New Patient Packet   . Diabetes mellitus without complication (HCC)   . Disorder of phosphorus metabolism    Per [facility] New Patient Packet   . High blood pressure    Per [facility] New Patient Packet   . Hypercalcemia    Per [facility] New Patient Packet   . Macular degeneration   . Non-rheumatic aortic stenosis    Per [facility] New Patient Packet   . Osteoporosis   . UTI (urinary tract infection)    Per [facility] New Patient Packet     Past Surgical History:  Procedure Laterality Date  . APPENDECTOMY  1953   Per [facility] New Patient Packet   . COLONOSCOPY     Per [facility]  New Patient Packet   . TONSILLECTOMY  1939   Per [facility] New Patient Packet     Current Outpatient Medications  Medication Sig Dispense Refill  . amLODipine (NORVASC) 2.5 MG tablet Take 2.5 mg by mouth daily.    Marland Kitchen atorvastatin (LIPITOR) 20 MG tablet Take 20 mg by mouth daily.    . carvedilol (COREG) 3.125 MG tablet Take 1 tablet (3.125 mg total) by mouth 2 (two) times daily with a meal. 60 tablet 2  . docusate sodium (COLACE) 100 MG capsule Take 100 mg by mouth 2 (two) times daily.    . ferrous sulfate 325 (65 FE) MG EC tablet Take 325 mg by mouth in the morning and at bedtime.     Marland Kitchen glimepiride (AMARYL) 4 MG tablet Take 4 mg by mouth in the morning and at bedtime.     Marland Kitchen glucose blood test strip Check blood sugar twice daily as directed E11.9 100 each 12  . Lancets (ONETOUCH ULTRASOFT) lancets Check blood sugar twice daily DX E11.9 100 each 12  . metFORMIN (GLUCOPHAGE) 500 MG tablet Take 500 mg by mouth 2 (two) times daily with a meal.    . Multiple Vitamins-Minerals (ICAPS MV PO) Take 2 tablets by mouth daily.     Marland Kitchen  pantoprazole (PROTONIX) 40 MG tablet Take 40 mg by mouth daily.    . phosphorus (K PHOS NEUTRAL) 155-852-130 MG tablet Take 250 mg by mouth 4 (four) times daily. For 30 days    . psyllium (METAMUCIL) 58.6 % packet Take 1 packet by mouth daily.    Marland Kitchen SALINE NASAL MIST NA Place 1 spray into the nose daily.    . sitaGLIPtin (JANUVIA) 100 MG tablet Take 100 mg by mouth daily.     No current facility-administered medications for this visit.    Allergies as of 05/16/2019 - Review Complete 05/16/2019  Allergen Reaction Noted  . Ampicillin Rash 08/02/2016  . Penicillins Rash 08/02/2016  . Sulfur Rash 04/30/2019    Family History  Problem Relation Age of Onset  . Psoriasis Father        Of the liver   . Osteoporosis Sister   . Lung cancer Brother   . Osteoporosis Sister   . Colon cancer Neg Hx   . Stomach cancer Neg Hx   . Esophageal cancer Neg Hx   . Pancreatic cancer Neg  Hx     Social History   Socioeconomic History  . Marital status: Widowed    Spouse name: Not on file  . Number of children: Not on file  . Years of education: Not on file  . Highest education level: Not on file  Occupational History  . Not on file  Tobacco Use  . Smoking status: Never Smoker  . Smokeless tobacco: Never Used  Substance and Sexual Activity  . Alcohol use: Yes    Comment: Infrequently   . Drug use: No  . Sexual activity: Not Currently    Partners: Male  Other Topics Concern  . 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 Yes   Do you have difficulty preparing food or eating? Yes   Do you have difficulty managing your medications? Yes   Do you have difficulty managing your finances? Yes   Do you have difficulty affording your medications? Yes   Social Determinants of Health   Financial Resource Strain:   . Difficulty of Paying Living Expenses:   Food Insecurity:   . Worried About Charity fundraiser in the Last Year:   . Arboriculturist in the Last Year:   Transportation Needs:   . Film/video editor (Medical):   Marland Kitchen Lack of Transportation (  Non-Medical):   Physical Activity:   . Days of Exercise per Week:   . Minutes of Exercise per Session:   Stress:     . Feeling of Stress :   Social Connections:   . Frequency of Communication with Friends and Family:   . Frequency of Social Gatherings with Friends and Family:   . Attends Religious Services:   . Active Member of Clubs or Organizations:   . Attends Banker Meetings:   Marland Kitchen Marital Status:   Intimate Partner Violence:   . Fear of Current or Ex-Partner:   . Emotionally Abused:   Marland Kitchen Physically Abused:   . Sexually Abused:     Review of Systems:    Constitutional: No weight loss, fever or chills Skin: No rash  Cardiovascular: No chest pain Respiratory: No SOB  Gastrointestinal: See HPI and otherwise negative Genitourinary: No dysuria  Neurological: No headache, dizziness or syncope Musculoskeletal: No new muscle or joint pain Hematologic: No bleeding  Psychiatric: No history of depression or anxiety   Physical Exam:  Vital signs: BP 116/68   Pulse 90   Temp 97.8 F (36.6 C)   Ht 5' (1.524 m)   Wt 120 lb (54.4 kg)   BMI 23.44 kg/m   Constitutional:   Pleasant Elderly Caucasian female appears to be in NAD, Well developed, Well nourished, alert and cooperative Head:  Normocephalic and atraumatic. Eyes:   PEERL, EOMI. No icterus. Conjunctiva pink. Ears:  Normal auditory acuity. Neck:  Supple Throat: Oral cavity and pharynx without inflammation, swelling or lesion.  Respiratory: Respirations even and unlabored. Lungs clear to auscultation bilaterally.   No wheezes, crackles, or rhonchi.  Cardiovascular: Normal S1, S2. No MRG. Regular rate and rhythm. No peripheral edema, cyanosis or pallor.  Gastrointestinal:  Soft, nondistended, mild epigastric ttp. No rebound or guarding. Normal bowel sounds. No appreciable masses or hepatomegaly. Rectal:  Not performed.  Msk:  Symmetrical without gross deformities. Without edema, no deformity or joint abnormality.  Neurologic:  Alert and  oriented x4;  grossly normal neurologically.  Skin:   Dry and intact without significant  lesions or rashes. Psychiatric: Demonstrates good judgement and reason without abnormal affect or behaviors.  RELEVANT LABS AND IMAGING: CBC    Component Value Date/Time   WBC 6.6 05/01/2019 0810   RBC 3.82 05/01/2019 0810   HGB 10.6 (L) 05/01/2019 0810   HCT 33.3 (L) 05/01/2019 0810   PLT 160 05/01/2019 0810   MCV 87.2 05/01/2019 0810   MCH 27.7 05/01/2019 0810   MCHC 31.8 (L) 05/01/2019 0810   RDW 16.6 (H) 05/01/2019 0810   LYMPHSABS 1,465 05/01/2019 0810   MONOABS 0.7 08/03/2016 0820   EOSABS 99 05/01/2019 0810   BASOSABS 20 05/01/2019 0810    CMP     Component Value Date/Time   NA 138 05/01/2019 0810   K 3.6 05/01/2019 0810   CL 101 05/01/2019 0810   CO2 27 05/01/2019 0810   GLUCOSE 155 (H) 05/01/2019 0810   BUN 20 05/01/2019 0810   CREATININE 0.81 05/01/2019 0810   CALCIUM 9.6 05/01/2019 0810   PROT 6.6 05/01/2019 0810   ALBUMIN 3.9 08/02/2016 2151   AST 14 05/01/2019 0810   ALT 12 05/01/2019 0810   ALKPHOS 69 08/02/2016 2151   BILITOT 0.6 05/01/2019 0810   GFRNONAA 64 05/01/2019 0810   GFRAA 74 05/01/2019 0810    Assessment: 1.  GERD: Regardless of Pantoprazole daily which she was using prior to a week ago when  she started using it as needed; consider gastritis+/-PUD+/-H. pylori 2.  Nausea: With above  Plan: 1.  Discussed with the patient that due to her age and aortic stenosis she would be high risk for any procedures.  I do not recommend that we pursue these unless necessary.  She agreed. 2.  Explained to the patient that she needs to increase her Pantoprazole to 40 mg twice daily, scheduled 30 minutes before breakfast and dinner.  Prescribed #60 with 5 refills. 3.  Encouraged the patient to buy over-the-counter Mylanta.  She can use this as needed in between for breakthrough symptoms. 4.  Told the patient and her daughter to call me in the next 2 weeks and let me know how she is doing.  If she is not feeling complete relief then would recommend that she add  in Pepcid 40 mg, likely would need this in the afternoon as well as right before going to sleep. 5.  Patient was scheduled for follow-up with me in 4 to 6 weeks.  She was assigned to Dr. Rhea Belton today.  Hyacinth Meeker, PA-C [facility] 05/16/2019, 9:58 AM  Cc: Sharon Seller, NP

## 2019-05-16 NOTE — Addendum Note (Signed)
Addended by: Mariane Duval on: 05/16/2019 10:11 AM   Modules accepted: Orders

## 2019-05-17 ENCOUNTER — Encounter (INDEPENDENT_AMBULATORY_CARE_PROVIDER_SITE_OTHER): Payer: Self-pay | Admitting: Ophthalmology

## 2019-05-17 ENCOUNTER — Ambulatory Visit (INDEPENDENT_AMBULATORY_CARE_PROVIDER_SITE_OTHER): Payer: Medicare Other | Admitting: Ophthalmology

## 2019-05-17 ENCOUNTER — Other Ambulatory Visit: Payer: Self-pay

## 2019-05-17 DIAGNOSIS — H353232 Exudative age-related macular degeneration, bilateral, with inactive choroidal neovascularization: Secondary | ICD-10-CM | POA: Diagnosis not present

## 2019-05-17 DIAGNOSIS — H353134 Nonexudative age-related macular degeneration, bilateral, advanced atrophic with subfoveal involvement: Secondary | ICD-10-CM | POA: Diagnosis not present

## 2019-05-17 DIAGNOSIS — E1140 Type 2 diabetes mellitus with diabetic neuropathy, unspecified: Secondary | ICD-10-CM | POA: Diagnosis not present

## 2019-05-17 DIAGNOSIS — H3530 Unspecified macular degeneration: Secondary | ICD-10-CM | POA: Insufficient documentation

## 2019-05-17 NOTE — Progress Notes (Signed)
05/17/2019     CHIEF COMPLAINT Patient presents for Retina Evaluation   HISTORY OF PRESENT ILLNESS: Stacey Ritter is a 86 y.o. female who presents to the clinic today for:   HPI    Retina Evaluation    In both eyes.  Associated Symptoms Distortion and Floaters.  Context:  distance vision and near vision.  Treatments tried include no treatments.          Comments    Referral from [facility] for dry AMD OU (Hx of injections OS) Patient states that her vision is decreasing OU. Patient states that everyone is blurry and she cant go shopping because she cant see anything.       Last edited by Gerda Diss on 05/17/2019  9:57 AM. (History)      Referring physician: Clent Jacks, MD [address] [place],  [place] [address]  HISTORICAL INFORMATION:   Selected notes from the MEDICAL RECORD NUMBER    Lab Results  Component Value Date   HGBA1C 6.6 (H) 05/01/2019     CURRENT MEDICATIONS: No current outpatient medications on file. (Ophthalmic Drugs)   No current facility-administered medications for this visit. (Ophthalmic Drugs)   Current Outpatient Medications (Other)  Medication Sig  . amLODipine (NORVASC) 2.5 MG tablet Take 2.5 mg by mouth daily.  Marland Kitchen atorvastatin (LIPITOR) 20 MG tablet Take 20 mg by mouth daily.  . carvedilol (COREG) 3.125 MG tablet Take 1 tablet (3.125 mg total) by mouth 2 (two) times daily with a meal.  . docusate sodium (COLACE) 100 MG capsule Take 100 mg by mouth 2 (two) times daily.  . ferrous sulfate 325 (65 FE) MG EC tablet Take 325 mg by mouth in the morning and at bedtime.   Marland Kitchen glimepiride (AMARYL) 4 MG tablet Take 4 mg by mouth in the morning and at bedtime.   Marland Kitchen glucose blood test strip Check blood sugar twice daily as directed E11.9  . Lancets (ONETOUCH ULTRASOFT) lancets Check blood sugar twice daily DX E11.9  . metFORMIN (GLUCOPHAGE) 500 MG tablet Take 500 mg by mouth 2 (two) times daily with a meal.  . Multiple Vitamins-Minerals (ICAPS MV  PO) Take 2 tablets by mouth daily.   . pantoprazole (PROTONIX) 40 MG tablet Take 1 tablet (40 mg total) by mouth 2 (two) times daily before a meal.  . phosphorus (K PHOS NEUTRAL) 155-852-130 MG tablet Take 250 mg by mouth 4 (four) times daily. For 30 days  . psyllium (METAMUCIL) 58.6 % packet Take 1 packet by mouth daily.  Marland Kitchen SALINE NASAL MIST NA Place 1 spray into the nose daily.  . sitaGLIPtin (JANUVIA) 100 MG tablet Take 100 mg by mouth daily.   No current facility-administered medications for this visit. (Other)      REVIEW OF SYSTEMS:    ALLERGIES Allergies  Allergen Reactions  . Ampicillin Rash  . Penicillins Rash    Has patient had a PCN reaction causing immediate rash, facial/tongue/throat swelling, SOB or lightheadedness with hypotension: Yes Has patient had a PCN reaction causing severe rash involving mucus membranes or skin necrosis: No Has patient had a PCN reaction that required hospitalization: No Has patient had a PCN reaction occurring within the last 10 years: No If all of the above answers are "NO", then may proceed with Cephalosporin use.   Marland Kitchen Sulfur Rash    PAST MEDICAL HISTORY Past Medical History:  Diagnosis Date  . Anemia   . Aortic valve stenosis, moderate: Per 2-D echo  08/04/2016 08/04/2016  . Blindness    Per [facility] New Patient Packet   . Diabetes mellitus without complication (HCC)   . Disorder of phosphorus metabolism    Per [facility] New Patient Packet   . High blood pressure    Per [facility] New Patient Packet   . Hypercalcemia    Per [facility] New Patient Packet   . Macular degeneration   . Non-rheumatic aortic stenosis    Per [facility] New Patient Packet   . Osteoporosis   . UTI (urinary tract infection)    Per [facility] New Patient Packet    Past Surgical History:  Procedure Laterality Date  . APPENDECTOMY  1953   Per [facility] New Patient Packet   . CATARACT EXTRACTION Right 11/17/1982   Dr. Loney Hering - Model 108B / 15D  . CATARACT EXTRACTION Left 12/11/1991   Dr. Swazil[place]  - Model [id number] / 14.5D  . COLONOSCOPY     Per [facility] New Patient Packet   . TONSILLECTOMY  1939   Per [facility] New Patient Packet     FAMILY HISTORY Family History  Problem Relation Age of Onset  . Psoriasis Father        Of the liver   . Osteoporosis Sister   . Lung cancer Brother   . Osteoporosis Sister   . Colon cancer Neg Hx   . Stomach cancer Neg Hx   . Esophageal cancer Neg Hx   . Pancreatic cancer Neg Hx     SOCIAL HISTORY Social History   Tobacco Use  . Smoking status: Never Smoker  . Smokeless tobacco: Never Used  Substance Use Topics  . Alcohol use: Yes    Comment: Infrequently   . Drug use: No         OPHTHALMIC EXAM:  Base Eye Exam    Visual Acuity (Snellen - Linear)      Right Left   Dist cc CF @8 ' 20/200   Dist ph cc 20/200-1 NI       Tonometry (Tonopen, 10:02 AM)      Right Left   Pressure 14 12       Pupils      Pupils Dark Light Shape React APD   Right PERRL 4 4 Round Minimal None   Left PERRL 4 4 Round Minimal None       Visual Fields (Counting fingers)      Left Right    Full Full       Extraocular Movement      Right Left    Full Full       Neuro/Psych    Oriented x3: Yes   Mood/Affect: Normal       Dilation    Both eyes: 1.0% Mydriacyl, 2.5% Phenylephrine @ 10:02 AM        Slit Lamp and Fundus Exam    External Exam      Right Left   External Normal Normal       Slit Lamp Exam      Right Left   Lids/Lashes Normal Normal   Conjunctiva/Sclera White and quiet White and quiet   Cornea Clear Clear   Anterior Chamber Deep and quiet Deep and quiet   Iris Round and reactive Round and reactive   Lens Posterior chamber intraocular lens Posterior chamber intraocular lens   Anterior Vitreous Normal Normal       Fundus Exam      Right Left   Posterior Vitreous Posterior vitreous detachment Posterior vitreous detachment  Disc Peripapillary atrophy Peripapillary atrophy   C/D Ratio 0.3 0.2-0.3   Macula Geographic atrophy  into the FAZ,, myopic macular contours, no hemorrhage, no disciform scar Geographic atrophy into the FAZ, with myopic macular contours, no exudates, no hemorrhage   Vessels Normal,, no diabetic retinopathy Normal,, no diabetic retinopathy   Periphery Normal Normal          IMAGING AND PROCEDURES  Imaging and Procedures for 05/17/19  OCT, Retina - OU - Both Eyes       Right Eye Quality was good. Scan locations included subfoveal. Central Foveal Thickness: 318. Progression has no prior data. Findings include abnormal foveal contour, myopic contour.   Left Eye Quality was good. Scan locations included subfoveal. Progression has no prior data. Findings include abnormal foveal contour, central retinal atrophy, outer retinal atrophy, preretinal fibrosis, myopic contour.   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 The results of the AREDS 2 study was discussed with the patient. A diet rich in dark leafy green vegetables was advised and specific recommendations were made regarding supplements with AREDS 2 formulation . Control of hypertension and serum cholesterol may slow the disease. Smoking cessation is mandatory to slow the disease and diminish the risk of progressing to wet age related macular degeneration. The patient was instructed in the use of an Amsler Grid and was told to return immediately for any changes in the Grid. Stressed to the patient do not rub eyes      ICD-10-CM   1. 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 Advanced nonexudative age-related  macular degeneration of both eyes with subfoveal involvement  H35.3134 OCT, Retina - OU - Both Eyes  3. Myopic macular degeneration of both eyes  H35.30 OCT, Retina - OU - Both Eyes  4. Type 2 diabetes mellitus with diabetic neuropathy, without long-term current use of insulin (HCC)  E11.40     1.  This patient's condition has been stable now for some years.  History of treatment of wet macular degeneration in the left eye in [place].  This condition is now stable.  History of myopic macular degeneration compounded by dry age-related macular degeneration exists.  2.  Will observe. 3.  Ophthalmic Meds Ordered this visit:  No orders of the defined types were placed in this encounter.      Return in about 1 year (around 05/16/2020) for DILATE OU, OCT.  There are no Patient Instructions on file for this visit.   Explained the diagnoses, plan, and follow up with the patient and they expressed understanding.  Patient expressed understanding of the importance of proper follow up care.   Alford Highland Georgeanna Radziewicz M.D. Diseases & Surgery of the Retina and Vitreous [facility] 05/17/19     Abbreviations: M myopia (nearsighted); A astigmatism; H hyperopia (farsighted); P presbyopia; Mrx spectacle prescription;  CTL contact lenses; OD right eye; OS left eye; OU both eyes  XT exotropia; ET esotropia; PEK punctate epithelial keratitis; PEE punctate epithelial erosions; DES dry eye syndrome; MGD meibomian gland dysfunction; ATs artificial tears; PFAT's preservative  free artificial tears; NSC nuclear sclerotic cataract; PSC posterior subcapsular cataract; ERM epi-retinal membrane; PVD posterior vitreous detachment; RD retinal detachment; DM diabetes mellitus; DR diabetic retinopathy; NPDR non-proliferative diabetic retinopathy; PDR proliferative diabetic retinopathy; CSME clinically significant macular edema; DME diabetic macular edema; dbh dot blot hemorrhages; CWS cotton wool spot; POAG  primary open angle glaucoma; C/D cup-to-disc ratio; HVF humphrey visual field; GVF goldmann visual field; OCT optical coherence tomography; IOP intraocular pressure; BRVO Branch retinal vein occlusion; CRVO central retinal vein occlusion; CRAO central retinal artery occlusion; BRAO branch retinal artery occlusion; RT retinal tear; SB scleral buckle; PPV pars plana vitrectomy; VH Vitreous hemorrhage; PRP panretinal laser photocoagulation; IVK intravitreal kenalog; VMT vitreomacular traction; MH Macular hole;  NVD neovascularization of the disc; NVE neovascularization elsewhere; AREDS age related eye disease study; ARMD age related macular degeneration; POAG primary open angle glaucoma; EBMD epithelial/anterior basement membrane dystrophy; ACIOL anterior chamber intraocular lens; IOL intraocular lens; PCIOL posterior chamber intraocular lens; Phaco/IOL phacoemulsification with intraocular lens placement; PRK photorefractive keratectomy; LASIK laser assisted in situ keratomileusis; HTN hypertension; DM diabetes mellitus; COPD chronic obstructive pulmonary disease

## 2019-05-17 NOTE — Addendum Note (Signed)
Addended by: Soundra Pilon on: 05/17/2019 11:49 AM   Modules accepted: Orders

## 2019-05-17 NOTE — Assessment & Plan Note (Signed)

## 2019-05-21 ENCOUNTER — Other Ambulatory Visit: Payer: Self-pay | Admitting: *Deleted

## 2019-05-21 DIAGNOSIS — E1142 Type 2 diabetes mellitus with diabetic polyneuropathy: Secondary | ICD-10-CM

## 2019-05-21 MED ORDER — METFORMIN HCL 500 MG PO TABS: 500.0000 mg | ORAL_TABLET | Freq: Two times a day (BID) | ORAL | 1 refills | Status: DC

## 2019-05-21 MED ORDER — CARVEDILOL 3.125 MG PO TABS: 3.1250 mg | ORAL_TABLET | Freq: Two times a day (BID) | ORAL | 1 refills | Status: DC

## 2019-05-21 MED ORDER — SITAGLIPTIN PHOSPHATE 100 MG PO TABS: 100.0000 mg | ORAL_TABLET | Freq: Every day | ORAL | 1 refills | Status: DC

## 2019-05-21 MED ORDER — ATORVASTATIN CALCIUM 20 MG PO TABS: 20.0000 mg | ORAL_TABLET | Freq: Every day | ORAL | 1 refills | Status: DC

## 2019-05-21 MED ORDER — ONETOUCH ULTRASOFT LANCETS MISC: 3 refills | Status: AC

## 2019-05-21 MED ORDER — FERROUS SULFATE 325 (65 FE) MG PO TBEC: 325.0000 mg | DELAYED_RELEASE_TABLET | Freq: Two times a day (BID) | ORAL | 1 refills | Status: DC

## 2019-05-21 MED ORDER — GLUCOSE BLOOD VI STRP: ORAL_STRIP | 3 refills | Status: AC

## 2019-05-21 NOTE — Telephone Encounter (Signed)
Patient daughter Alexia Freestone requested refill. Pended Rx and sent to [facility] for approval due to HIGH ALERT Warning.

## 2019-05-22 ENCOUNTER — Ambulatory Visit: Payer: Self-pay | Admitting: Cardiology

## 2019-05-23 NOTE — Progress Notes (Signed)
Addendum: Reviewed and agree with assessment and management plan. Abbi Mancini M, MD  

## 2019-05-30 ENCOUNTER — Other Ambulatory Visit: Payer: Self-pay

## 2019-06-01 ENCOUNTER — Other Ambulatory Visit: Payer: Self-pay

## 2019-06-01 ENCOUNTER — Encounter: Payer: Self-pay | Admitting: Endocrinology

## 2019-06-01 ENCOUNTER — Ambulatory Visit: Payer: Medicare Other | Admitting: Endocrinology

## 2019-06-01 DIAGNOSIS — M810 Age-related osteoporosis without current pathological fracture: Secondary | ICD-10-CM | POA: Insufficient documentation

## 2019-06-01 DIAGNOSIS — M8000XA Age-related osteoporosis with current pathological fracture, unspecified site, initial encounter for fracture: Secondary | ICD-10-CM | POA: Diagnosis not present

## 2019-06-01 LAB — VITAMIN D 25 HYDROXY (VIT D DEFICIENCY, FRACTURES): VITD: 44.87 ng/mL (ref 30.00–100.00)

## 2019-06-01 LAB — MAGNESIUM: Magnesium: 1.6 mg/dL (ref 1.5–2.5)

## 2019-06-01 LAB — PHOSPHORUS: Phosphorus: 2.4 mg/dL (ref 2.3–4.6)

## 2019-06-01 NOTE — Progress Notes (Signed)
Subjective:    Patient ID: Stacey Ritter, female    DOB: March 03, 1926, 86 y.o.   MRN: [id number]  HPI Pt is referred by Abbey Chatters, NP, for hypercalcemia.  Pt was noted to have hypercalcemia in 2021.  she has never had urolithiasis, thyroid probs, parathyroid probs, sarcoidosis, cancer, PUD, pancreatitis.  She has had these bony fractures: spinal compression fractures of the spine, both shoulders, and ribs.  she does not take vitamin-D or A supplements.  Pt denies taking antacids, Li++, or HCTZ.  Past Medical History:  Diagnosis Date  . Anemia   . Aortic valve stenosis, moderate: Per 2-D echo 08/04/2016 08/04/2016  . Blindness    Per [facility] New Patient Packet   . Diabetes mellitus without complication (HCC)   . Disorder of phosphorus metabolism    Per [facility] New Patient Packet   . High blood pressure    Per [facility] New Patient Packet   . Hypercalcemia    Per [facility] New Patient Packet   . Macular degeneration   . Non-rheumatic aortic stenosis    Per [facility] New Patient Packet   . Osteoporosis   . UTI (urinary tract infection)    Per [facility] New Patient Packet     Past Surgical History:  Procedure Laterality Date  . APPENDECTOMY  1953   Per [facility] New Patient Packet   . CATARACT EXTRACTION Right 11/17/1982   Dr. Loney Hering - Model 108B / 15D  . CATARACT EXTRACTION Left 12/11/1991   Dr. Swazil[place] - Model [id number] / 14.5D  . COLONOSCOPY     Per [facility] New Patient Packet   . TONSILLECTOMY  1939   Per [facility] New Patient Packet     Social History   Socioeconomic History  . Marital status: Widowed    Spouse name: Not on file  . Number of children: Not on file  . Years of education: Not on file  . Highest education level: Not on file  Occupational History  . Not on file  Tobacco Use  . Smoking status: Never Smoker  . Smokeless tobacco: Never Used  Substance and Sexual Activity  . Alcohol use: Yes    Comment: Infrequently   . Drug use: No  . Sexual activity: Not Currently    Partners: Male  Other  Topics Concern  . 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 Yes   Do you have difficulty  preparing food or eating? Yes   Do you have difficulty managing your medications? Yes   Do you have difficulty managing your finances? Yes   Do you have difficulty affording your medications? Yes   Social Determinants of Health   Financial Resource Strain:   . Difficulty of Paying Living Expenses:   Food Insecurity:   . Worried About Programme researcher, broadcasting/film/video in the Last Year:   . Barista in the Last Year:   Transportation Needs:   . Freight forwarder (Medical):   Marland Kitchen Lack of Transportation (Non-Medical):   Physical Activity:   . Days of Exercise per Week:   . Minutes of Exercise per Session:   Stress:   . Feeling of Stress :   Social Connections:   . Frequency of Communication with Friends and Family:   . Frequency of Social Gatherings with Friends and Family:   . Attends Religious Services:   . Active Member of Clubs or Organizations:   . Attends Banker Meetings:   Marland Kitchen Marital Status:   Intimate Partner Violence:   . Fear of Current or Ex-Partner:   . Emotionally Abused:   Marland Kitchen Physically Abused:   . Sexually Abused:     Current Outpatient Medications on File Prior to  Visit  Medication Sig Dispense Refill  . amLODipine (NORVASC) 2.5 MG tablet Take 2.5 mg by mouth daily.    Marland Kitchen atorvastatin (LIPITOR) 20 MG tablet Take 1 tablet (20 mg total) by mouth daily. 90 tablet 1  . carvedilol (COREG) 3.125 MG tablet Take 1 tablet (3.125 mg total) by mouth 2 (two) times daily with a meal. 180 tablet 1  . docusate sodium (COLACE) 100 MG capsule Take 100 mg by mouth 2 (two) times daily.    . ferrous sulfate 325 (65 FE) MG EC tablet Take 1 tablet (325 mg total) by mouth in the morning and at bedtime. 180 tablet 1  . glimepiride (AMARYL) 4 MG tablet Take 4 mg by mouth in the morning and at bedtime.     Marland Kitchen glucose blood test strip Check blood sugar twice daily as directed E11.9 200 each 3  . Lancets (ONETOUCH ULTRASOFT) lancets Check blood sugar twice daily DX E11.9 200 each 3  . metFORMIN (GLUCOPHAGE) 500 MG tablet Take 1 tablet (500 mg total) by mouth 2 (two) times daily with a meal. 180 tablet 1  . Multiple Vitamins-Minerals (ICAPS MV PO) Take 2 tablets by mouth daily.     . pantoprazole (PROTONIX) 40 MG tablet Take 1 tablet (40 mg total) by mouth 2 (two) times daily before a meal. 60 tablet 3  . psyllium (METAMUCIL) 58.6 % packet Take 1 packet by mouth daily.    Marland Kitchen SALINE NASAL MIST NA Place 1 spray into the nose daily.    . sitaGLIPtin (JANUVIA) 100 MG tablet Take 1 tablet (100 mg total) by mouth daily. 90 tablet 1   No current facility-administered medications on file prior to visit.    Allergies  Allergen Reactions  . Ampicillin Rash  . Penicillins Rash    Has patient had a PCN reaction causing immediate rash, facial/tongue/throat swelling, SOB or lightheadedness with hypotension: Yes Has patient had a PCN reaction causing severe rash involving mucus membranes or skin necrosis: No Has patient had a PCN reaction that required hospitalization: No Has patient had a PCN reaction occurring within the last 10 years: No If all of the above answers are "NO", then may proceed  with Cephalosporin use.   . Sulfur Rash    Family History  Problem Relation Age of Onset  . Psoriasis Father        Of the liver   . Osteoporosis Sister   . Lung cancer Brother   . Osteoporosis Sister   . Colon cancer Neg Hx   . Stomach cancer Neg Hx   . Esophageal cancer Neg Hx   . Pancreatic cancer Neg Hx   . Hyperparathyroidism Neg Hx     BP 112/70   Pulse 91   Ht 5' (1.524 m)   Wt 119 lb (54 kg)   SpO2 98%   BMI 23.24 kg/m    Review of Systems denies heartburn, edema, cramps, and memory loss. She has lost weight.  She has cold intolerance, back pain, and intermitt falls.     Objective:   Physical Exam VS: see vs page GEN: no distress HEAD: head: no deformity eyes: no periorbital swelling, no proptosis external nose and ears are normal NECK: supple, thyroid is not enlarged CHEST WALL: mild kyphosis LUNGS: clear to auscultation CV: reg rate and rhythm; high-pitched syst murmur (pt says chronic).   MUSCULOSKELETAL: muscle bulk and strength are grossly normal.  no obvious joint swelling.  gait is steady with a walker.  EXTEMITIES: no deformity.  Trace bilat leg edema PULSES: no carotid bruit NEURO:  cn 2-12 grossly intact.   readily moves all 4's.  sensation is intact to touch on all 4's SKIN:  Normal texture and temperature.  No rash or suspicious lesion is visible.   NODES:  None palpable at the neck PSYCH: alert, well-oriented.  Does not appear anxious nor depressed.    outside test results are reviewed: When she lived in [place] recently, she was advised to be checked for poss hyperparathyroidism, due to low urine calcium.       Assessment & Plan:  Hypercalcemia, resolved for uncertain reason, but she is at risk for recurrence.    Patient Instructions  Blood tests are requested for you today.  We'll let you know about the results.

## 2019-06-01 NOTE — Patient Instructions (Signed)
Blood tests are requested for you today.  We'll let you know about the results.  

## 2019-06-04 ENCOUNTER — Other Ambulatory Visit: Payer: Self-pay | Admitting: *Deleted

## 2019-06-04 LAB — PTH, INTACT AND CALCIUM
Calcium: 10.4 mg/dL (ref 8.6–10.4)
PTH: 8 pg/mL — ABNORMAL LOW (ref 14–64)

## 2019-06-04 MED ORDER — DOCUSATE SODIUM 100 MG PO CAPS: 100.0000 mg | ORAL_CAPSULE | Freq: Two times a day (BID) | ORAL | 1 refills | Status: DC

## 2019-06-04 MED ORDER — AMLODIPINE BESYLATE 2.5 MG PO TABS: 2.5000 mg | ORAL_TABLET | Freq: Every day | ORAL | 1 refills | Status: DC

## 2019-06-04 NOTE — Telephone Encounter (Signed)
Patient daughter requested refill. Faxed to pharmacy.

## 2019-06-05 ENCOUNTER — Telehealth: Payer: Self-pay

## 2019-06-05 ENCOUNTER — Ambulatory Visit: Payer: Self-pay | Admitting: Gastroenterology

## 2019-06-05 NOTE — Telephone Encounter (Signed)
LAB RESULTS  Lab results were reviewed by Dr. Ellison. A letter has been mailed to pt home address. For future reference, letter can be found in Epic. 

## 2019-06-05 NOTE — Telephone Encounter (Signed)
-----   Message from Romero Belling, MD sent at 06/04/2019  7:09 PM EDT ----- please contact patient: Calcium is normal--good.  You should have this checked 1-2 times per year.  Either your primary care provider or I would be happy to do this.

## 2019-06-18 ENCOUNTER — Ambulatory Visit (INDEPENDENT_AMBULATORY_CARE_PROVIDER_SITE_OTHER): Payer: Medicare Other | Admitting: Nurse Practitioner

## 2019-06-18 ENCOUNTER — Encounter: Payer: Self-pay | Admitting: Nurse Practitioner

## 2019-06-18 ENCOUNTER — Other Ambulatory Visit: Payer: Self-pay

## 2019-06-18 VITALS — BP 118/64 | HR 77 | Temp 97.1°F | Ht 60.0 in | Wt 120.0 lb

## 2019-06-18 DIAGNOSIS — I350 Nonrheumatic aortic (valve) stenosis: Secondary | ICD-10-CM

## 2019-06-18 DIAGNOSIS — L821 Other seborrheic keratosis: Secondary | ICD-10-CM

## 2019-06-18 DIAGNOSIS — H6123 Impacted cerumen, bilateral: Secondary | ICD-10-CM

## 2019-06-18 DIAGNOSIS — Z7189 Other specified counseling: Secondary | ICD-10-CM

## 2019-06-18 DIAGNOSIS — K219 Gastro-esophageal reflux disease without esophagitis: Secondary | ICD-10-CM

## 2019-06-18 DIAGNOSIS — M810 Age-related osteoporosis without current pathological fracture: Secondary | ICD-10-CM

## 2019-06-18 DIAGNOSIS — E213 Hyperparathyroidism, unspecified: Secondary | ICD-10-CM

## 2019-06-18 DIAGNOSIS — E1142 Type 2 diabetes mellitus with diabetic polyneuropathy: Secondary | ICD-10-CM | POA: Diagnosis not present

## 2019-06-18 DIAGNOSIS — I10 Essential (primary) hypertension: Secondary | ICD-10-CM

## 2019-06-18 MED ORDER — LOSARTAN POTASSIUM 25 MG PO TABS: 12.5000 mg | ORAL_TABLET | Freq: Every day | ORAL | 1 refills | Status: DC

## 2019-06-18 MED ORDER — SITAGLIPTIN PHOSPHATE 100 MG PO TABS: 100.0000 mg | ORAL_TABLET | Freq: Every day | ORAL | 1 refills | Status: DC

## 2019-06-18 NOTE — Patient Instructions (Addendum)
Please call insurance to see if she has had her prevnar 13 and pneumovax 23  Would recommend getting shingrix vaccine at the pharmacy  Decrease finger sticks to 3 times, or as needed

## 2019-06-18 NOTE — Progress Notes (Signed)
Careteam: Patient Care Team: Sharon Seller, NP as PCP - General (Geriatric Medicine)  PLACE OF SERVICE:  [facility], [place] CLINIC  Advanced Directive information    Allergies  Allergen Reactions   Ampicillin Rash   Penicillins Rash    Has patient had a PCN reaction causing immediate rash, facial/tongue/throat swelling, SOB or lightheadedness with hypotension: Yes Has patient had a PCN reaction causing severe rash involving mucus membranes or skin necrosis: No Has patient had a PCN reaction that required hospitalization: No Has patient had a PCN reaction occurring within the last 10 years: No If all of the above answers are "NO", then may proceed with Cephalosporin use.    Sulfur Rash    Chief Complaint  Patient presents with   Follow-up    6 week follow-up    Quality Metric Gaps    Foot exam due today. Discuss need for eye, MALB, and Dexa   Immunizations    Discuss need for TD and PNA   Skin Problem    Examine moles and other skin abnormalities. Patient may need referral to dermatologist    Dizziness    Patient with dizziness and questions if related to ear problem    Medication Refill    Refill Januvia at [facility]      HPI: Patient is a 86 y.o. female for new patient follow up  hyperparathyroidism newly diagnosised by previous PCP- she saw endocrinologist on 5/21. Calcium was normal at that time. To monitor twice yearly at this time. Supplement was stopped.  GERD- GI recommended to cut back protonix to twice daily and symptoms have improved. To use mylanta if needed but has not needed.   DM- A1c 6.6, has follow up with ophthalmologist and retina specialist.   Pt has hx of osteoporosis with fracture  Feels like she has had both of her pneumonia vaccines.   Several moles scattered to her back that have always been there.   On and off dizziness that has been there for many years. None currently but was told it was due to her ears being stopped up in the past.    Review of Systems:  Review of Systems  Constitutional: Negative for chills, fever and weight loss.  HENT: Negative for tinnitus.   Respiratory: Negative for cough, sputum production and shortness of breath.   Cardiovascular: Negative for chest pain, palpitations and leg swelling.  Gastrointestinal: Negative for abdominal pain, constipation, diarrhea and heartburn.  Genitourinary: Negative for dysuria, frequency and urgency.  Musculoskeletal: Negative for back pain, falls, joint pain and myalgias.  Skin: Negative.   Neurological: Positive for dizziness (occasional, none at this time). Negative for headaches.  Psychiatric/Behavioral: Negative for depression and memory loss. The patient does not have insomnia.     Past Medical History:  Diagnosis Date   Anemia    Aortic valve stenosis, moderate: Per 2-D echo 08/04/2016 08/04/2016   Blindness    Per [facility] New Patient Packet    Diabetes mellitus without complication (HCC)    Disorder of phosphorus metabolism    Per [facility] New Patient Packet    High blood pressure    Per [facility] New Patient Packet    Hypercalcemia    Per [facility] New Patient Packet    Macular degeneration    Non-rheumatic aortic stenosis    Per [facility] New Patient Packet    Osteoporosis    UTI (urinary tract infection)    Per [facility] New Patient Packet    Past Surgical History:  Procedure Laterality Date   APPENDECTOMY  1953   Per [facility] New Patient Packet    CATARACT EXTRACTION Right 11/17/1982   Dr. Loney Hering - Model 108B / 15D   CATARACT EXTRACTION Left 12/11/1991   Dr. [place] - Model [id number] / 14.5D   COLONOSCOPY     Per [facility] New Patient Packet    TONSILLECTOMY  1939   Per [facility] New Patient Packet    Social History:   reports that she has never smoked. She has never used smokeless tobacco. She reports previous alcohol use. She reports that she does not use drugs.  Family History  Problem Relation Age of Onset   Psoriasis Father        Of the liver     Osteoporosis Sister    Lung cancer Brother    Osteoporosis Sister    Colon cancer Neg Hx    Stomach cancer Neg Hx    Esophageal cancer Neg Hx    Pancreatic cancer Neg Hx    Hyperparathyroidism Neg Hx     Medications: Patient's Medications  New Prescriptions   No medications on file  Previous Medications   AMLODIPINE (NORVASC) 2.5 MG TABLET    Take 1 tablet (2.5 mg total) by mouth daily.   ATORVASTATIN (LIPITOR) 20 MG TABLET    Take 1 tablet (20 mg total) by mouth daily.   CARVEDILOL (COREG) 3.125 MG TABLET    Take 1 tablet (3.125 mg total) by mouth 2 (two) times daily with a meal.   DOCUSATE SODIUM (COLACE) 100 MG CAPSULE    Take 100 mg by mouth every other day.   FERROUS SULFATE 325 (65 FE) MG EC TABLET    Take 1 tablet (325 mg total) by mouth in the morning and at bedtime.   GLIMEPIRIDE (AMARYL) 4 MG TABLET    Take 4 mg by mouth in the morning and at bedtime.    GLUCOSE BLOOD TEST STRIP    Check blood sugar twice daily as directed E11.9   LANCETS (ONETOUCH ULTRASOFT) LANCETS    Check blood sugar twice daily DX E11.9   METFORMIN (GLUCOPHAGE) 500 MG TABLET    Take 1 tablet (500 mg total) by mouth 2 (two) times daily with a meal.   MULTIPLE VITAMINS-MINERALS (ICAPS MV PO)    Take 2 tablets by mouth daily.    PANTOPRAZOLE (PROTONIX) 40 MG TABLET    Take 1 tablet (40 mg total) by mouth 2 (two) times daily before a meal.   PSYLLIUM (METAMUCIL) 58.6 % PACKET    Take 1 packet by mouth daily.   SALINE NASAL MIST NA    Place 1 spray into the nose daily.   SITAGLIPTIN (JANUVIA) 100 MG TABLET    Take 1 tablet (100 mg total) by mouth daily.  Modified Medications   No medications on file  Discontinued Medications   DOCUSATE SODIUM (COLACE) 100 MG CAPSULE    Take 1 capsule (100 mg total) by mouth 2 (two) times daily.    Physical Exam:  Vitals:   06/18/19 1053  BP: 118/64  Pulse: 77  Temp: (!) 97.1 F (36.2 C)  TempSrc: Temporal  SpO2: 99%  Weight: 120 lb (54.4 kg)  Height: 5'  (1.524 m)   Body mass index is 23.44 kg/m. Wt Readings from Last 3 Encounters:  06/18/19 120 lb (54.4 kg)  06/01/19 119 lb (54 kg)  05/16/19 120 lb (54.4 kg)    Physical Exam Constitutional:      General: She  is not in acute distress.    Appearance: She is well-developed. She is not diaphoretic.  HENT:     Head: Normocephalic and atraumatic.     Right Ear: There is impacted cerumen.     Left Ear: There is impacted cerumen.     Mouth/Throat:     Pharynx: No oropharyngeal exudate.  Eyes:     Conjunctiva/sclera: Conjunctivae normal.     Pupils: Pupils are equal, round, and reactive to light.  Cardiovascular:     Rate and Rhythm: Normal rate and regular rhythm.     Heart sounds: Normal heart sounds.  Pulmonary:     Effort: Pulmonary effort is normal.     Breath sounds: Normal breath sounds.  Abdominal:     General: Bowel sounds are normal.     Palpations: Abdomen is soft.  Musculoskeletal:        General: No tenderness.     Cervical back: Normal range of motion and neck supple.  Skin:    General: Skin is warm and dry.     Comments: seborrheic keratosis scattered on back and abdomen   Neurological:     Mental Status: She is alert and oriented to person, place, and time.    Labs reviewed: Basic Metabolic Panel: Recent Labs    05/01/19 0810 06/01/19 0907  NA 138  --   K 3.6  --   CL 101  --   CO2 27  --   GLUCOSE 155*  --   BUN 20  --   CREATININE 0.81  --   CALCIUM 9.6 10.4  MG  --  1.6  PHOS  --  2.4  TSH 1.75  --    Liver Function Tests: Recent Labs    05/01/19 0810  AST 14  ALT 12  BILITOT 0.6  PROT 6.6   No results for input(s): LIPASE, AMYLASE in the last 8760 hours. No results for input(s): AMMONIA in the last 8760 hours. CBC: Recent Labs    05/01/19 0810  WBC 6.6  NEUTROABS 4,448  HGB 10.6*  HCT 33.3*  MCV 87.2  PLT 160   Lipid Panel: Recent Labs    05/01/19 0810  CHOL 147  HDL 56  LDLCALC 75  TRIG 84  CHOLHDL 2.6    TSH: Recent Labs    05/01/19 0810  TSH 1.75   A1C: Lab Results  Component Value Date   HGBA1C 6.6 (H) 05/01/2019     Assessment/Plan 1. Gastroesophageal reflux disease, unspecified whether esophagitis present -improved significantly at this time. Continues with protonix 40 mg BID 30 mins prior to meals and dietary modifications.  2. Aortic valve stenosis, moderate Stable, pt without symptoms at this time. She has had a lot of appts and would like to hold off on seeing the cardiologist, since she is asymptomatic and 89- would not like aggressive treatment this is appropriate.   3. Hyperparathyroidism (HCC) Followed with endocrine and Calcium levels have improved, will continue to monitor.   4. Advance care planning MOST form completed, pt is DNR  5. Osteoporosis, unspecified osteoporosis type, unspecified pathological fracture presence -encouraged cal and vit d, she may be agreeable to treatment at this time. Agreeable for bone density - DG Bone Density; Future  6. Type 2 diabetes mellitus with diabetic polyneuropathy, without long-term current use of insulin (HCC) -A1C at goal  -Encouraged dietary compliance, routine foot care/monitoring and to keep up with diabetic eye exams through ophthalmology  -no hypoglycemia noted - sitaGLIPtin (JANUVIA)  100 MG tablet; Take 1 tablet (100 mg total) by mouth daily.  Dispense: 90 tablet; Refill: 1 - losartan (COZAAR) 25 MG tablet; Take 0.5 tablets (12.5 mg total) by mouth daily.  Dispense: 30 tablet; Refill: 1 started for renal protection - Microalbumin, urine  7. Essential hypertension -controlled, however not on ACEi or ARB for renal protection. Will stop norvasc and start losartan 12.5 mg daily and have daughter monitor bp. Goal <140/90.  - losartan (COZAAR) 25 MG tablet; Take 0.5 tablets (12.5 mg total) by mouth daily.  Dispense: 30 tablet; Refill: 1  8. Bilateral impacted cerumen -lavage completed, grabber used to remove  remaining wax, pt tolerated well.   9. Seborrheic keratosis -noted, reassurance given  Next appt: 10/22/2019 1 month for follow up bp and BMP Woodford Strege K. [place], [facility] [phone]

## 2019-06-19 LAB — MICROALBUMIN, URINE: Microalb, Ur: 14.2 mg/dL

## 2019-06-20 ENCOUNTER — Telehealth: Payer: Self-pay | Admitting: *Deleted

## 2019-06-20 DIAGNOSIS — E1142 Type 2 diabetes mellitus with diabetic polyneuropathy: Secondary | ICD-10-CM

## 2019-06-20 DIAGNOSIS — I10 Essential (primary) hypertension: Secondary | ICD-10-CM

## 2019-06-20 MED ORDER — LOSARTAN POTASSIUM 25 MG PO TABS: 25.0000 mg | ORAL_TABLET | Freq: Every day | ORAL | 1 refills | Status: DC

## 2019-06-20 NOTE — Telephone Encounter (Signed)
Patient caregiver notified and agreed. 

## 2019-06-20 NOTE — Addendum Note (Signed)
Addended by: Nelda Severe A on: 06/20/2019 02:33 PM   Modules accepted: Orders

## 2019-06-20 NOTE — Telephone Encounter (Signed)
Okay have her take a whole and monitor blood pressure

## 2019-06-20 NOTE — Telephone Encounter (Signed)
Patient Caregiver called and stated that Shanda Bumps changed patient's blood pressure medication at last visit from Norvasc to Losartan a 1/2 tablet Stated that the pill is so small that it crumbles when they try to cut in half.  Please Advise.    OV Note Dated 06/18/19: 7. Essential hypertension -controlled, however not on ACEi or ARB for renal protection. Will stop norvasc and start losartan 12.5 mg daily and have daughter monitor bp. Goal <140/90.  - losartan (COZAAR) 25 MG tablet; Take 0.5 tablets (12.5 mg total) by mouth daily.  Dispense: 30 tablet; Refill: 1

## 2019-06-25 ENCOUNTER — Telehealth (INDEPENDENT_AMBULATORY_CARE_PROVIDER_SITE_OTHER): Payer: Medicare Other | Admitting: Family

## 2019-06-25 ENCOUNTER — Other Ambulatory Visit: Payer: Self-pay

## 2019-06-25 ENCOUNTER — Encounter: Payer: Self-pay | Admitting: Family

## 2019-06-25 DIAGNOSIS — R0989 Other specified symptoms and signs involving the circulatory and respiratory systems: Secondary | ICD-10-CM | POA: Diagnosis not present

## 2019-06-25 DIAGNOSIS — N898 Other specified noninflammatory disorders of vagina: Secondary | ICD-10-CM | POA: Diagnosis not present

## 2019-06-25 MED ORDER — PREMARIN 0.625 MG/GM VA CREA: TOPICAL_CREAM | VAGINAL | 12 refills | Status: DC

## 2019-06-25 MED ORDER — GUAIFENESIN 100 MG/5ML PO SOLN: 5.0000 mL | Freq: Four times a day (QID) | ORAL | 0 refills | Status: DC | PRN

## 2019-06-25 MED ORDER — LORATADINE 10 MG PO TABS: 10.0000 mg | ORAL_TABLET | Freq: Every day | ORAL | 0 refills | Status: DC

## 2019-06-25 NOTE — Patient Instructions (Signed)
-  increase water intake or warm fluid   - Gurgle throat with warm water and salt and spit at least once daily and as needed.  - loratadine (CLARITIN) 10 MG tablet; Take 1 tablet (10 mg total) by mouth daily.  Dispense: 30 tablet; Refill: 0  - guaiFENesin (ROBITUSSIN) 100 MG/5ML SOLN; Take 5 mLs (100 mg total) by mouth every 6 (six) hours as needed for cough or to loosen phlegm.  Dispense: 236 mL; Refill: 0  - conjugated estrogens (PREMARIN) vaginal cream; One application three time per week  Dispense: 42.5 g; Refill: 12  Notuft provider's office if symptoms worsen or running a fever,chills or shortness of breath.

## 2019-06-25 NOTE — Progress Notes (Signed)
This service is provided via telemedicine  No vital signs collected/recorded due to the encounter was a telemedicine visit.   Location of patient (ex: home, work): Home.  Patient consents to a telephone visit: Yes.  Location of the provider (ex: office, home):  [facility].  Name of any referring provider: N/A  Names of all persons participating in the telemedicine service and their role in the encounter: Patient, Daughter Patty, Meda Klinefelter, RMA, Reannah Totten, Carilyn Goodpasture, NP.    Time spent on call: 8 minutes spent on the phone with Medical Assistant.     Provider: Inice Sanluis FNP-C  Sharon Seller, NP  Patient Care Team: Sharon Seller, NP as PCP - General (Geriatric Medicine)  Extended Emergency Contact Information Primary Emergency Contact: Barbaraann Barthel [place] Mobile Phone: [phone] Relation: Daughter  Code Status:  DNR Goals of care: Advanced Directive information Advanced Directives 06/25/2019  Does Patient Have a Medical Advance Directive? Yes  Type of Advance Directive Out of facility DNR (pink MOST or yellow form)  Does patient want to make changes to medical advance directive? No - Patient declined  Copy of Healthcare Power of Attorney in Chart? -     Chief Complaint  Patient presents with  . Acute Visit    Complains of cough, and congestion.    HPI:  Pt is a 86 y.o. female seen today for an acute visit for evaluation of cough, and congestion x 1 day.she didn't sleep well last night due to cough.Has used cough drops.Also complains of sore throat,runny nose and nasal congestion.she denies any fever,chillis,or shortness of breath.Also no loss of taste or smell.No contact with sick person with COVID-19 symptoms.Has completed her COVID-19 vaccine.  Also request refill of Premarin 0.625 mg vaginal cream states having some vaginal dryness.Daughter states patient was prescribed cream by her previous PCP.Cream helps with her  symptoms.    Past Medical History:  Diagnosis Date  . Anemia   . Aortic valve stenosis, moderate: Per 2-D echo 08/04/2016 08/04/2016  . Blindness    Per [facility] New Patient Packet   . Diabetes mellitus without complication (HCC)   . Disorder of phosphorus metabolism    Per [facility] New Patient Packet   . High blood pressure    Per [facility] New Patient Packet   . Hypercalcemia    Per [facility] New Patient Packet   . Macular degeneration   . Non-rheumatic aortic stenosis    Per [facility] New Patient Packet   . Osteoporosis   . UTI (urinary tract infection)    Per [facility] New Patient Packet    Past Surgical History:  Procedure Laterality Date  . APPENDECTOMY  1953   Per [facility] New Patient Packet   . CATARACT EXTRACTION Right 11/17/1982   Dr. Loney Hering - Model 108B / 15D  . CATARACT EXTRACTION Left 12/11/1991   Dr. [place] - Model [id number] / 14.5D  . COLONOSCOPY     Per [facility] New Patient Packet   . TONSILLECTOMY  1939   Per [facility] New Patient Packet     Allergies  Allergen Reactions  . Ampicillin Rash  . Penicillins Rash    Has patient had a PCN reaction causing immediate rash, facial/tongue/throat swelling, SOB or lightheadedness with hypotension: Yes Has patient had a PCN reaction causing severe rash involving mucus membranes or skin necrosis: No Has patient had a PCN reaction that required hospitalization: No Has patient had a PCN reaction occurring within the last 10 years: No  If all of the above answers are "NO", then may proceed with Cephalosporin use.   . Sulfur Rash    Outpatient Encounter Medications as of 06/25/2019  Medication Sig  . amLODipine (NORVASC) 2.5 MG tablet Take 1 tablet (2.5 mg total) by mouth daily.  Marland Kitchen atorvastatin (LIPITOR) 20 MG tablet Take 1 tablet (20 mg total) by mouth daily.  . carvedilol (COREG) 3.125 MG tablet Take 1 tablet (3.125 mg total) by mouth 2 (two) times daily with a meal.  . docusate sodium (COLACE) 100 MG capsule Take 100 mg by mouth every other day.  . ferrous sulfate  325 (65 FE) MG EC tablet Take 1 tablet (325 mg total) by mouth in the morning and at bedtime.  Marland Kitchen glimepiride (AMARYL) 4 MG tablet Take 4 mg by mouth in the morning and at bedtime.   Marland Kitchen glucose blood test strip Check blood sugar twice daily as directed E11.9  . Lancets (ONETOUCH ULTRASOFT) lancets Check blood sugar twice daily DX E11.9  . losartan (COZAAR) 25 MG tablet Take 1 tablet (25 mg total) by mouth daily.  . metFORMIN (GLUCOPHAGE) 500 MG tablet Take 1 tablet (500 mg total) by mouth 2 (two) times daily with a meal.  . Multiple Vitamins-Minerals (ICAPS MV PO) Take 2 tablets by mouth daily.   . pantoprazole (PROTONIX) 40 MG tablet Take 1 tablet (40 mg total) by mouth 2 (two) times daily before a meal.  . psyllium (METAMUCIL) 58.6 % packet Take 1 packet by mouth daily.  Marland Kitchen SALINE NASAL MIST NA Place 1 spray into the nose daily.  . sitaGLIPtin (JANUVIA) 100 MG tablet Take 1 tablet (100 mg total) by mouth daily.   No facility-administered encounter medications on file as of 06/25/2019.    Review of Systems  Constitutional: Negative for appetite change, chills, fatigue and fever.  HENT: Positive for congestion, rhinorrhea and sore throat. Negative for sinus pressure, sinus pain and sneezing.   Respiratory: Positive for cough. Negative for chest tightness, shortness of breath and wheezing.   Cardiovascular: Negative for chest pain, palpitations and leg swelling.  Gastrointestinal: Negative for abdominal distention, abdominal pain, constipation, diarrhea, nausea and vomiting.  Skin: Negative for color change and pallor.  Neurological: Negative for dizziness, speech difficulty, light-headedness, numbness and headaches.    Immunization History  Administered Date(s) Administered  . Influenza-Unspecified 10/12/2018  . PFIZER SARS-COV-2 Vaccination 04/17/2019, 05/15/2019   Pertinent  Health Maintenance Due  Topic Date Due  . FOOT EXAM  Never done  . OPHTHALMOLOGY EXAM  Never done  . DEXA SCAN   Never done  . PNA vac Low Risk Adult (1 of 2 - PCV13) Never done  . INFLUENZA VACCINE  08/12/2019  . HEMOGLOBIN A1C  10/31/2019   Fall Risk  06/25/2019 06/18/2019 04/30/2019  Falls in the past year? 0 0 1  Number falls in past yr: 0 0 1  Comment - - slide out of bed  Injury with Fall? 0 0 0  Risk for fall due to : - - History of fall(s)   There were no vitals filed for this visit. There is no height or weight on file to calculate BMI. Physical Exam Unable to complete on telephone visit.   Labs reviewed: Recent Labs    05/01/19 0810 06/01/19 0907  NA 138  --   K 3.6  --   CL 101  --   CO2 27  --   GLUCOSE 155*  --   BUN 20  --  CREATININE 0.81  --   CALCIUM 9.6 10.4  MG  --  1.6  PHOS  --  2.4   Recent Labs    05/01/19 0810  AST 14  ALT 12  BILITOT 0.6  PROT 6.6   Recent Labs    05/01/19 0810  WBC 6.6  NEUTROABS 4,448  HGB 10.6*  HCT 33.3*  MCV 87.2  PLT 160   Lab Results  Component Value Date   TSH 1.75 05/01/2019   Lab Results  Component Value Date   HGBA1C 6.6 (H) 05/01/2019   Lab Results  Component Value Date   CHOL 147 05/01/2019   HDL 56 05/01/2019   LDLCALC 75 05/01/2019   TRIG 84 05/01/2019   CHOLHDL 2.6 05/01/2019    Significant Diagnostic Results in last 30 days:  No results found.  Assessment/Plan  Symptoms of upper respiratory infection (URI) Afebrile.reports runny nose,sore throat and cough x 1 day.No shortness of breath reported.Appetite is good.  - Advised to increase water intake or warm fluid  - Gurgle throat with warm water and salt and spit at least once daily and as needed. - Take loratadine 10 mg tablet one by mouth daily  - loratadine (CLARITIN) 10 MG tablet; Take 1 tablet (10 mg total) by mouth daily.  Dispense: 30 tablet; Refill: 0 - guaiFENesin (ROBITUSSIN) 100 MG/5ML SOLN; Take 5 mLs (100 mg total) by mouth every 6 (six) hours as needed for cough or to loosen phlegm.  Dispense: 236 mL; Refill: 0 - Advised to notify  provider if symptoms worsen or running a fever,chills or shortness of breath.  2. Vaginal dryness Has used Premarin vaginal cream with improvement of symptoms.  - conjugated estrogens (PREMARIN) vaginal cream; One application three time per week  Dispense: 42.5 g; Refill: 12  Family/ staff Communication: Reviewed plan of care with patient and daughter verbalized understanding.   Labs/tests ordered: None   Next Appointment: As needed if symptoms worsen or fail to improve.   Spent 12 minutes of non-face to face with patient and daughter.  I connected with  Jani Files on 06/25/19 by Telephone call enabled telemedicine application and verified that I am speaking with the correct person using two identifiers.   I discussed the limitations of evaluation and management by telemedicine. The patient expressed understanding and agreed to proceed.   Caesar Bookman, NP

## 2019-07-13 ENCOUNTER — Encounter: Payer: Self-pay | Admitting: Nurse Practitioner

## 2019-07-13 ENCOUNTER — Other Ambulatory Visit: Payer: Self-pay

## 2019-07-13 ENCOUNTER — Ambulatory Visit (INDEPENDENT_AMBULATORY_CARE_PROVIDER_SITE_OTHER): Payer: Medicare Other | Admitting: Nurse Practitioner

## 2019-07-13 VITALS — BP 128/76 | HR 78 | Temp 97.1°F | Ht 60.0 in | Wt 116.0 lb

## 2019-07-13 DIAGNOSIS — R634 Abnormal weight loss: Secondary | ICD-10-CM | POA: Diagnosis not present

## 2019-07-13 DIAGNOSIS — E1142 Type 2 diabetes mellitus with diabetic polyneuropathy: Secondary | ICD-10-CM | POA: Diagnosis not present

## 2019-07-13 DIAGNOSIS — J302 Other seasonal allergic rhinitis: Secondary | ICD-10-CM

## 2019-07-13 DIAGNOSIS — I10 Essential (primary) hypertension: Secondary | ICD-10-CM | POA: Diagnosis not present

## 2019-07-13 LAB — BASIC METABOLIC PANEL WITH GFR
BUN/Creatinine Ratio: 36 (calc) — ABNORMAL HIGH (ref 6–22)
BUN: 31 mg/dL — ABNORMAL HIGH (ref 7–25)
CO2: 27 mmol/L (ref 20–32)
Calcium: 10.1 mg/dL (ref 8.6–10.4)
Chloride: 102 mmol/L (ref 98–110)
Creat: 0.85 mg/dL (ref 0.60–0.88)
GFR, Est African American: 69 mL/min/{1.73_m2} (ref >=60)
GFR, Est Non African American: 60 mL/min/{1.73_m2} (ref >=60)
Glucose, Bld: 74 mg/dL (ref 65–139)
Potassium: 3.8 mmol/L (ref 3.5–5.3)
Sodium: 139 mmol/L (ref 135–146)

## 2019-07-13 MED ORDER — LORATADINE 10 MG PO TABS: 10.0000 mg | ORAL_TABLET | Freq: Every day | ORAL | 1 refills | Status: DC

## 2019-07-13 MED ORDER — LOSARTAN POTASSIUM 25 MG PO TABS: 25.0000 mg | ORAL_TABLET | Freq: Every day | ORAL | 1 refills | Status: DC

## 2019-07-13 NOTE — Progress Notes (Signed)
Careteam: Patient Care Team: Sharon Seller, NP as PCP - General (Geriatric Medicine)  PLACE OF SERVICE:  [facility]  Advanced Directive information    Allergies  Allergen Reactions   Ampicillin Rash   Penicillins Rash    Has patient had a PCN reaction causing immediate rash, facial/tongue/throat swelling, SOB or lightheadedness with hypotension: Yes Has patient had a PCN reaction causing severe rash involving mucus membranes or skin necrosis: No Has patient had a PCN reaction that required hospitalization: No Has patient had a PCN reaction occurring within the last 10 years: No If all of the above answers are "NO", then may proceed with Cephalosporin use.    Sulfur Rash    Chief Complaint  Patient presents with   Follow-up    B/P follow-up. Patient with B/P log to review    Dizziness    Patient with 1 episode of a dizzy spell on 6/30/201 (b/p 173/82, p 74, BS 120)   Quality Metric Gaps    Discuss mammogram and DEXA recommendations, patient would like to know if she should continue to get       HPI: Patient is a 86 y.o. female for follow up on blood pressure Stopped norvasc and started losartan 25 mg daily  Had a dizzy episode and blood pressure was high. Otherwise blood pressure ranging from 128-146/74-85, generally sbp <140.   Not taking carotin daily but feels like she should. Occasional dizzy epsiode and contributes to possible allergies, did not have allergies prior to moving here from [place]   Lost 4 lbs in the last month. Has cut back on the "goodies"   Review of Systems:  Review of Systems  Constitutional: Negative for chills and fever.  Respiratory: Negative for cough and shortness of breath.   Cardiovascular: Negative for chest pain and leg swelling.  Neurological: Positive for dizziness (occasionally). Negative for weakness and headaches.  Endo/Heme/Allergies: Positive for environmental allergies.   Past Medical History:  Diagnosis  Date   Anemia    Aortic valve stenosis, moderate: Per 2-D echo 08/04/2016 08/04/2016   Blindness    Per [facility] New Patient Packet    Diabetes mellitus without complication (HCC)    Disorder of phosphorus metabolism    Per [facility] New Patient Packet    High blood pressure    Per [facility] New Patient Packet    Hypercalcemia    Per [facility] New Patient Packet    Macular degeneration    Non-rheumatic aortic stenosis    Per [facility] New Patient Packet    Osteoporosis    UTI (urinary tract infection)    Per [facility] New Patient Packet    Past Surgical History:  Procedure Laterality Date   APPENDECTOMY  1953   Per [facility] New Patient Packet    CATARACT EXTRACTION Right 11/17/1982   Dr. Loney Hering - Model 108B / 15D   CATARACT EXTRACTION Left 12/11/1991   Dr. Sw[place] - Model [id number] / 14.5D   COLONOSCOPY     Per [facility] New Patient Packet    TONSILLECTOMY  1939   Per [facility] New Patient Packet    Social History:   reports that she has never smoked. She has never used smokeless tobacco. She reports previous alcohol use. She reports that she does not use drugs.  Family History  Problem Relation Age of Onset   Psoriasis Father        Of the liver    Osteoporosis Sister    Lung cancer Brother  Osteoporosis Sister    Colon cancer Neg Hx    Stomach cancer Neg Hx    Esophageal cancer Neg Hx    Pancreatic cancer Neg Hx    Hyperparathyroidism Neg Hx     Medications: Patient's Medications  New Prescriptions   No medications on file  Previous Medications   ATORVASTATIN (LIPITOR) 20 MG TABLET    Take 1 tablet (20 mg total) by mouth daily.   CARVEDILOL (COREG) 3.125 MG TABLET    Take 1 tablet (3.125 mg total) by mouth 2 (two) times daily with a meal.   CONJUGATED ESTROGENS (PREMARIN) VAGINAL CREAM    One application three time per week   DOCUSATE SODIUM (COLACE) 100 MG CAPSULE    Take 100 mg by mouth every other day.   FERROUS SULFATE 325 (65 FE) MG EC TABLET    Take 1 tablet (325 mg total) by mouth  in the morning and at bedtime.   GLIMEPIRIDE (AMARYL) 4 MG TABLET    Take 4 mg by mouth in the morning and at bedtime.    GLUCOSE BLOOD TEST STRIP    Check blood sugar twice daily as directed E11.9   GUAIFENESIN (ROBITUSSIN) 100 MG/5ML SOLN    Take 5 mLs (100 mg total) by mouth every 6 (six) hours as needed for cough or to loosen phlegm.   LANCETS (ONETOUCH ULTRASOFT) LANCETS    Check blood sugar twice daily DX E11.9   LORATADINE (CLARITIN) 10 MG TABLET    Take 1 tablet (10 mg total) by mouth daily.   LOSARTAN (COZAAR) 25 MG TABLET    Take 1 tablet (25 mg total) by mouth daily.   METFORMIN (GLUCOPHAGE) 500 MG TABLET    Take 1 tablet (500 mg total) by mouth 2 (two) times daily with a meal.   MULTIPLE VITAMINS-MINERALS (ICAPS MV PO)    Take 2 tablets by mouth daily.    PANTOPRAZOLE (PROTONIX) 40 MG TABLET    Take 1 tablet (40 mg total) by mouth 2 (two) times daily before a meal.   PSYLLIUM (METAMUCIL) 58.6 % PACKET    Take 1 packet by mouth daily.   SALINE NASAL MIST NA    Place 1 spray into the nose daily.   SITAGLIPTIN (JANUVIA) 100 MG TABLET    Take 1 tablet (100 mg total) by mouth daily.  Modified Medications   No medications on file  Discontinued Medications   AMLODIPINE (NORVASC) 2.5 MG TABLET    Take 1 tablet (2.5 mg total) by mouth daily.    Physical Exam:  Vitals:   07/13/19 1520  BP: 128/76  Pulse: 78  Temp: (!) 97.1 F (36.2 C)  TempSrc: Temporal  SpO2: 98%  Weight: 116 lb (52.6 kg)  Height: 5' (1.524 m)   Body mass index is 22.65 kg/m. Wt Readings from Last 3 Encounters:  07/13/19 116 lb (52.6 kg)  06/18/19 120 lb (54.4 kg)  06/01/19 119 lb (54 kg)    Physical Exam Constitutional:      General: She is not in acute distress.    Appearance: She is well-developed. She is not diaphoretic.  HENT:     Head: Normocephalic and atraumatic.     Right Ear: Tympanic membrane normal. There is no impacted cerumen.     Left Ear: Tympanic membrane normal. There is no impacted  cerumen.  Eyes:     Conjunctiva/sclera: Conjunctivae normal.     Pupils: Pupils are equal, round, and reactive to light.  Cardiovascular:  Rate and Rhythm: Normal rate and regular rhythm.     Heart sounds: Normal heart sounds.  Pulmonary:     Effort: Pulmonary effort is normal.     Breath sounds: Normal breath sounds.  Musculoskeletal:        General: No tenderness.     Cervical back: Normal range of motion and neck supple.  Skin:    General: Skin is warm and dry.  Neurological:     Mental Status: She is alert and oriented to person, place, and time.     Labs reviewed: Basic Metabolic Panel: Recent Labs    05/01/19 0810 06/01/19 0907  NA 138  --   K 3.6  --   CL 101  --   CO2 27  --   GLUCOSE 155*  --   BUN 20  --   CREATININE 0.81  --   CALCIUM 9.6 10.4  MG  --  1.6  PHOS  --  2.4  TSH 1.75  --    Liver Function Tests: Recent Labs    05/01/19 0810  AST 14  ALT 12  BILITOT 0.6  PROT 6.6   No results for input(s): LIPASE, AMYLASE in the last 8760 hours. No results for input(s): AMMONIA in the last 8760 hours. CBC: Recent Labs    05/01/19 0810  WBC 6.6  NEUTROABS 4,448  HGB 10.6*  HCT 33.3*  MCV 87.2  PLT 160   Lipid Panel: Recent Labs    05/01/19 0810  CHOL 147  HDL 56  LDLCALC 75  TRIG 84  CHOLHDL 2.6   TSH: Recent Labs    05/01/19 0810  TSH 1.75   A1C: Lab Results  Component Value Date   HGBA1C 6.6 (H) 05/01/2019     Assessment/Plan 1. Type 2 diabetes mellitus with diabetic polyneuropathy, without long-term current use of insulin (HCC) -stable, A1c at goal. Wishes to cut back on blood sugars due to pain in fingers. Okay to only check PRN.  -continue metformin 500 mg daily, januvia 100 mg daily and amaryl 4 mg BID with dietary modifications.  - losartan (COZAAR) 25 MG tablet; Take 1 tablet (25 mg total) by mouth daily.  Dispense: 90 tablet; Refill: 1  2. Essential hypertension -tolerating losartan well, will continue on  current dose with dietary modifications.  - losartan (COZAAR) 25 MG tablet; Take 1 tablet (25 mg total) by mouth daily.  Dispense: 90 tablet; Refill: 1 - BASIC METABOLIC PANEL WITH GFR  3 seasonal allergies  - new allergies now that she has moved to [place], to continue loratadine (CLARITIN) 10 MG tablet; Take 1 tablet (10 mg total) by mouth daily.  Dispense: 90 tablet; Refill: 1  4. Weight loss -diet has changed now that she is with daughter, more protein less "junk" They will continue to monitor weight at home and notify if she continues to lose weight. She is eating well at this time.   Next appt: 4 months, sooner if needed  Jamela Cumbo K. Biagio Borg  [facility] [phone]

## 2019-08-08 ENCOUNTER — Other Ambulatory Visit: Payer: Self-pay | Admitting: *Deleted

## 2019-08-08 MED ORDER — GLIMEPIRIDE 4 MG PO TABS: 4.0000 mg | ORAL_TABLET | Freq: Two times a day (BID) | ORAL | 1 refills | Status: DC

## 2019-08-08 NOTE — Telephone Encounter (Signed)
Patient daughter requested refill Pended Rx and sent to [facility] for approval due to HIGH ALERT Warning.

## 2019-08-12 ENCOUNTER — Encounter: Payer: Self-pay | Admitting: Cardiology

## 2019-08-12 DIAGNOSIS — I351 Nonrheumatic aortic (valve) insufficiency: Secondary | ICD-10-CM | POA: Insufficient documentation

## 2019-08-12 DIAGNOSIS — Z7189 Other specified counseling: Secondary | ICD-10-CM | POA: Insufficient documentation

## 2019-08-12 NOTE — Progress Notes (Signed)
Cardiology Office Note   Date:  08/13/2019   ID:  Stacey Ritter, DOB [birth year], MRN [id number]  PCP:  Sharon Seller, NP  Cardiologist:   Rollene Rotunda, MD Referring:  Sharon Seller, NP  Chief Complaint  Patient presents with  . Heart Murmur      History of Present Illness: Stacey Ritter is a 86 y.o. female who presents for follow up of AS.  She is from [place] and was living in [place].  She is now here to be with her daughter.  She has seen a cardiologist in the past because of aortic stenosis.  In our system there was an echo in 2018 demonstrated a normal EF and moderate AS and AI.    The patient gets around with a walker because of balance issues, joint problems and back problems.  She also has visual impairment.  She moves slowly with all of this but when she does walk down a long hallway she does not describe any shortness of breath, PND or orthopnea.  She is not having any palpitations, presyncope or syncope.  She denies any chest pressure, neck or arm discomfort.  She has had no weight gain or edema.  She was having follow-up of her aortic stenosis but she says she would not want to have anything done about it.   Past Medical History:  Diagnosis Date  . Anemia   . Blindness   . Diabetes mellitus without complication (HCC)   . Disorder of phosphorus metabolism   . GERD (gastroesophageal reflux disease)   . High blood pressure   . Hypercalcemia   . Macular degeneration   . Neuropathy   . Non-rheumatic aortic stenosis   . Osteoporosis     Past Surgical History:  Procedure Laterality Date  . APPENDECTOMY  1953  . CATARACT EXTRACTION Right 11/17/1982   Dr. Loney Hering - Model 108B / 15D  . CATARACT EXTRACTION Left 12/11/1991   Dr. [place] - Model [id number] / 14.5D  . COLONOSCOPY    . TONSILLECTOMY  1939     Current Outpatient Medications  Medication Sig Dispense Refill  . Ascorbic Acid (VITAMIN C) 125 MG CHEW Chew by mouth.    Marland Kitchen atorvastatin  (LIPITOR) 20 MG tablet Take 1 tablet (20 mg total) by mouth daily. 90 tablet 1  . Carboxymethylcellulose Sodium (EYE DROPS OP) Apply to eye.    . carvedilol (COREG) 3.125 MG tablet Take 1 tablet (3.125 mg total) by mouth 2 (two) times daily with a meal. 180 tablet 1  . conjugated estrogens (PREMARIN) vaginal cream One application three time per week 42.5 g 12  . docusate sodium (COLACE) 100 MG capsule Take 100 mg by mouth every other day.    . ferrous sulfate 325 (65 FE) MG EC tablet Take 1 tablet (325 mg total) by mouth in the morning and at bedtime. 180 tablet 1  . glimepiride (AMARYL) 4 MG tablet Take 1 tablet (4 mg total) by mouth in the morning and at bedtime. 180 tablet 1  . glucose blood test strip Check blood sugar twice daily as directed E11.9 200 each 3  . Lancets (ONETOUCH ULTRASOFT) lancets Check blood sugar twice daily DX E11.9 200 each 3  . loratadine (CLARITIN) 10 MG tablet Take 1 tablet (10 mg total) by mouth daily. 90 tablet 1  . losartan (COZAAR) 25 MG tablet Take 1 tablet (25 mg total) by mouth daily. 90 tablet 1  . metFORMIN (GLUCOPHAGE) 500 MG tablet Take  1 tablet (500 mg total) by mouth 2 (two) times daily with a meal. 180 tablet 1  . Multiple Vitamin (MULTIVITAMIN ADULT PO) Take by mouth.    . pantoprazole (PROTONIX) 40 MG tablet Take 1 tablet (40 mg total) by mouth 2 (two) times daily before a meal. 60 tablet 3  . psyllium (METAMUCIL) 58.6 % packet Take 1 packet by mouth daily.    Marland Kitchen SALINE NASAL MIST NA Place 1 spray into the nose daily.    . sitaGLIPtin (JANUVIA) 100 MG tablet Take 1 tablet (100 mg total) by mouth daily. 90 tablet 1   No current facility-administered medications for this visit.    Allergies:   Ampicillin, Penicillins, and Sulfur    Social History:  The patient  reports that she has never smoked. She has never used smokeless tobacco. She reports previous alcohol use. She reports that she does not use drugs.   Family History:  The patient's family  history includes Lung cancer in her brother; Osteoporosis in her sister and sister; Psoriasis in her father.    ROS:  Please see the history of present illness.   Otherwise, review of systems are positive for none.   All other systems are reviewed and negative.    PHYSICAL EXAM: VS:  BP (!) 124/62 (BP Location: Right Arm, Patient Position: Sitting, Cuff Size: Normal)   Pulse 72   Ht 5\' 1"  (1.549 m)   Wt 115 lb 12.8 oz (52.5 kg)   BMI 21.88 kg/m  , BMI Body mass index is 21.88 kg/m. GENERAL:  Well appearing HEENT:  Pupils equal round and reactive, fundi not visualized, oral mucosa unremarkable NECK:  No jugular venous distention, waveform within normal limits, carotid upstroke brisk and symmetric, no bruits, no thyromegaly LYMPHATICS:  No cervical, inguinal adenopathy LUNGS:  Clear to auscultation bilaterally BACK: Lordosis CHEST:  Unremarkable HEART:  PMI not displaced or sustained,S1 and S2 within normal limits, no S3, no S4, no clicks, no rubs, 3 out of 6 mid to late peaking systolic murmur radiating up the aortic outflow tract, no diastolic murmurs ABD:  Flat, positive bowel sounds normal in frequency in pitch, no bruits, no rebound, no guarding, no midline pulsatile mass, no hepatomegaly, no splenomegaly EXT:  2 plus pulses throughout, no edema, no cyanosis no clubbing SKIN:  No rashes no nodules NEURO:  Cranial nerves II through XII grossly intact, motor grossly intact throughout PSYCH:  Cognitively intact, oriented to person place and time    EKG:  EKG is ordered today. The ekg ordered today demonstrates sinus rhythm, rate 72, axis within normal limits, intervals within normal limits, poor anterior R wave progression, no acute ST-T wave changes.   Recent Labs: 05/01/2019: ALT 12; Hemoglobin 10.6; Platelets 160; TSH 1.75 06/01/2019: Magnesium 1.6 07/13/2019: BUN 31; Creat 0.85; Potassium 3.8; Sodium 139    Lipid Panel    Component Value Date/Time   CHOL 147 05/01/2019 0810    TRIG 84 05/01/2019 0810   HDL 56 05/01/2019 0810   CHOLHDL 2.6 05/01/2019 0810   LDLCALC 75 05/01/2019 0810      Wt Readings from Last 3 Encounters:  08/13/19 115 lb 12.8 oz (52.5 kg)  07/13/19 116 lb (52.6 kg)  06/18/19 120 lb (54.4 kg)      Other studies Reviewed: Additional studies/ records that were reviewed today include: Echo, EKG. . Review of the above records demonstrates:  Please see elsewhere in the note.     ASSESSMENT AND PLAN:  AORTIC  VALVE DISORDER:  She has had moderate AS and AI.  We had a long discussion about this and she would not want any therapy to be electively planned and so there is no need to do further imaging.  She is to avoid salt which she does.  She would let me know if she ever has any symptoms such as increased shortness of breath.  HTN: She recently was switched from Norvasc to Cozaar.  I would agree with this.  DM: A1c was 6.6.  No change in therapy.  NEUROPATHY: I suggested Benfotiamine.   COVID EDUCATION: She has been vaccinated.   Current medicines are reviewed at length with the patient today.  The patient does not have concerns regarding medicines.  The following changes have been made:  no change  Labs/ tests ordered today include: None  Orders Placed This Encounter  Procedures  . EKG 12-Lead     Disposition:   FU with me in 2 years.     Signed, Rollene Rotunda, MD  08/13/2019 10:37 AM    [facility]

## 2019-08-13 ENCOUNTER — Other Ambulatory Visit: Payer: Self-pay

## 2019-08-13 ENCOUNTER — Ambulatory Visit: Payer: Medicare Other | Admitting: Cardiology

## 2019-08-13 ENCOUNTER — Encounter: Payer: Self-pay | Admitting: Cardiology

## 2019-08-13 VITALS — BP 124/62 | HR 72 | Ht 61.0 in | Wt 115.8 lb

## 2019-08-13 DIAGNOSIS — E118 Type 2 diabetes mellitus with unspecified complications: Secondary | ICD-10-CM

## 2019-08-13 DIAGNOSIS — I350 Nonrheumatic aortic (valve) stenosis: Secondary | ICD-10-CM | POA: Diagnosis not present

## 2019-08-13 DIAGNOSIS — Z7189 Other specified counseling: Secondary | ICD-10-CM

## 2019-08-13 DIAGNOSIS — I10 Essential (primary) hypertension: Secondary | ICD-10-CM

## 2019-08-13 DIAGNOSIS — I351 Nonrheumatic aortic (valve) insufficiency: Secondary | ICD-10-CM

## 2019-08-13 NOTE — Patient Instructions (Addendum)
Medication Instructions:   TAKE Benfotiamine as directed- can buy over the counter *If you need a refill on your cardiac medications before your next appointment, please call your pharmacy*  Lab Work: NONE ordered at this time of appointment   If you have labs (blood work) drawn today and your tests are completely normal, you will receive your results only by: Marland Kitchen MyChart Message (if you have MyChart) OR . A paper copy in the mail If you have any lab test that is abnormal or we need to change your treatment, we will call you to review the results.  Testing/Procedures: NONE ordered at this time of appointment   Follow-Up: At [facility], you and your health needs are our priority.  As part of our continuing mission to provide you with exceptional heart care, we have created designated Provider Care Teams.  These Care Teams include your primary Cardiologist (physician) and Advanced Practice Providers (APPs -  Physician Assistants and Nurse Practitioners) who all work together to provide you with the care you need, when you need it.  We recommend signing up for the patient portal called "MyChart".  Sign up information is provided on this After Visit Summary.  MyChart is used to connect with patients for Virtual Visits (Telemedicine).  Patients are able to view lab/test results, encounter notes, upcoming appointments, etc.  Non-urgent messages can be sent to your provider as well.   To learn more about what you can do with MyChart, go to [email or web address].    Your next appointment:   2 year(s)  The format for your next appointment:   In Person  Provider:   You may see Rollene Rotunda, MD or one of the following Advanced Practice Providers on your designated Care Team:    Theodore Demark, PA-C  Joni Reining, DNP, ANP  Cadence Fransico Michael, PA-C  Other Instructions

## 2019-09-07 ENCOUNTER — Ambulatory Visit (INDEPENDENT_AMBULATORY_CARE_PROVIDER_SITE_OTHER): Payer: Medicare Other | Admitting: Family

## 2019-09-07 ENCOUNTER — Encounter: Payer: Self-pay | Admitting: Family

## 2019-09-07 ENCOUNTER — Other Ambulatory Visit: Payer: Self-pay

## 2019-09-07 VITALS — BP 122/62 | HR 74 | Temp 97.7°F | Resp 16 | Ht 61.0 in | Wt 115.4 lb

## 2019-09-07 DIAGNOSIS — R399 Unspecified symptoms and signs involving the genitourinary system: Secondary | ICD-10-CM

## 2019-09-07 LAB — POCT URINALYSIS DIPSTICK
Glucose, UA: NEGATIVE
Ketones, UA: NEGATIVE
Nitrite, UA: NEGATIVE
Protein, UA: POSITIVE — AB
Spec Grav, UA: >=1.03 — AB (ref 1.010–1.025)
Urobilinogen, UA: NEGATIVE E.U./dL — AB
pH, UA: 5 (ref 5.0–8.0)

## 2019-09-07 MED ORDER — CRANBERRY 475 MG PO CAPS: 475.0000 mg | ORAL_CAPSULE | Freq: Two times a day (BID) | ORAL | 3 refills | Status: AC

## 2019-09-07 NOTE — Progress Notes (Signed)
Provider: Allante Beane FNP-C  Sharon Seller, NP  Patient Care Team: Sharon Seller, NP as PCP - General (Geriatric Medicine) Rollene Rotunda, MD as PCP - Cardiology (Cardiology)  Extended Emergency Contact Information Primary Emergency Contact: Barbaraann Barthel [place] of [place] Mobile Phone: [phone] Relation: Daughter  Code Status:  DNR Goals of care: Advanced Directive information Advanced Directives 09/07/2019  Does Patient Have a Medical Advance Directive? Yes  Type of Estate agent of [place];Out of facility DNR (pink MOST or yellow form)  Does patient want to make changes to medical advance directive? No - Patient declined  Copy of Healthcare Power of Attorney in Chart? Yes - validated most recent copy scanned in chart (See row information)     Chief Complaint  Patient presents with  . Acute Visit    Burning with urinating, urinary frequency every hour, and bump above naval area. Patient also complains of right ear.     HPI:  Pt is a 86 y.o. female seen today for an acute visit for evaluation of increased urine frequency and burning with urination.she states going every 1 hr,burns and feels warm than usual.she denies any fever,chills or abdominal,back pain,N/V.   Has a little lump on the navel.Not painful.No issues with constipation.No N/V/D.discussed with her and care giver options including scanning and whether would want to do further treatment or surgery if needed given her advance age.she opt to continue monitor for now will let provider know if symptoms worsen.  Also complains of right ear bothering her.No pain or ringing in the ear.Had ears cleaned   Past Medical History:  Diagnosis Date  . Anemia   . Blindness   . Diabetes mellitus without complication (HCC)   . Disorder of phosphorus metabolism   . GERD (gastroesophageal reflux disease)   . High blood pressure   . Hypercalcemia   . Macular degeneration   .  Neuropathy   . Non-rheumatic aortic stenosis   . Osteoporosis    Past Surgical History:  Procedure Laterality Date  . APPENDECTOMY  1953  . CATARACT EXTRACTION Right 11/17/1982   Dr. Loney Hering - Model 108B / 15D  . CATARACT EXTRACTION Left 12/11/1991   Dr. [place] - Model [id number] / 14.5D  . COLONOSCOPY    . TONSILLECTOMY  1939    Allergies  Allergen Reactions  . Ampicillin Rash  . Penicillins Rash    Has patient had a PCN reaction causing immediate rash, facial/tongue/throat swelling, SOB or lightheadedness with hypotension: Yes Has patient had a PCN reaction causing severe rash involving mucus membranes or skin necrosis: No Has patient had a PCN reaction that required hospitalization: No Has patient had a PCN reaction occurring within the last 10 years: No If all of the above answers are "NO", then may proceed with Cephalosporin use.   . Sulfur Rash    Outpatient Encounter Medications as of 09/07/2019  Medication Sig  . Ascorbic Acid (VITAMIN C) 125 MG CHEW Chew by mouth.  Marland Kitchen atorvastatin (LIPITOR) 20 MG tablet Take 1 tablet (20 mg total) by mouth daily.  . Carboxymethylcellulose Sodium (EYE DROPS OP) Apply to eye.  . carvedilol (COREG) 3.125 MG tablet Take 1 tablet (3.125 mg total) by mouth 2 (two) times daily with a meal.  . conjugated estrogens (PREMARIN) vaginal cream One application three time per week  . docusate sodium (COLACE) 100 MG capsule Take 100 mg by mouth every other day.  . ferrous sulfate 325 (65 FE) MG EC tablet Take 1  tablet (325 mg total) by mouth in the morning and at bedtime.  Marland Kitchen. glimepiride (AMARYL) 4 MG tablet Take 1 tablet (4 mg total) by mouth in the morning and at bedtime.  Marland Kitchen. glucose blood test strip Check blood sugar twice daily as directed E11.9  . Lancets (ONETOUCH ULTRASOFT) lancets Check blood sugar twice daily DX E11.9  . loratadine (CLARITIN) 10 MG tablet Take 1 tablet (10 mg total) by mouth daily.  Marland Kitchen. losartan (COZAAR) 25 MG tablet Take 1 tablet (25 mg  total) by mouth daily.  . metFORMIN (GLUCOPHAGE) 500 MG tablet Take 1 tablet (500 mg total) by mouth 2 (two) times daily with a meal.  . Multiple Vitamin (MULTIVITAMIN ADULT PO) Take by mouth.  . pantoprazole (PROTONIX) 40 MG tablet Take 1 tablet (40 mg total) by mouth 2 (two) times daily before a meal.  . psyllium (METAMUCIL) 58.6 % packet Take 1 packet by mouth daily.  Marland Kitchen. SALINE NASAL MIST NA Place 1 spray into the nose daily.  . sitaGLIPtin (JANUVIA) 100 MG tablet Take 1 tablet (100 mg total) by mouth daily.   No facility-administered encounter medications on file as of 09/07/2019.    Review of Systems  Constitutional: Negative for appetite change, chills, fatigue and fever.  HENT: Negative for congestion, ear discharge, ear pain, postnasal drip, sinus pressure, sinus pain, sneezing and sore throat.        Chronic allergies   Respiratory: Negative for cough, chest tightness, shortness of breath and wheezing.   Cardiovascular: Negative for chest pain, palpitations and leg swelling.  Gastrointestinal: Negative for abdominal distention, abdominal pain, constipation, diarrhea, nausea and vomiting.       Lump above the navel not painful   Genitourinary: Positive for frequency. Negative for difficulty urinating, dysuria, hematuria and urgency.       Burning   Musculoskeletal: Positive for gait problem. Negative for back pain and joint swelling.  Skin: Negative for color change, pallor and rash.  Neurological: Negative for dizziness, speech difficulty, weakness, light-headedness and headaches.       Chronic neuropathy   Psychiatric/Behavioral: Negative for agitation, confusion and sleep disturbance. The patient is not nervous/anxious.     Immunization History  Administered Date(s) Administered  . Influenza-Unspecified 10/12/2018  . PFIZER SARS-COV-2 Vaccination 04/17/2019, 05/15/2019  . Pneumococcal Conjugate-13 11/13/2014  . Pneumococcal Polysaccharide-23 11/29/2017   Pertinent  Health  Maintenance Due  Topic Date Due  . FOOT EXAM  Never done  . DEXA SCAN  Never done  . INFLUENZA VACCINE  08/12/2019  . HEMOGLOBIN A1C  10/31/2019  . OPHTHALMOLOGY EXAM  05/13/2020  . PNA vac Low Risk Adult  Completed   Fall Risk  09/07/2019 06/25/2019 06/18/2019 04/30/2019  Falls in the past year? 0 0 0 1  Number falls in past yr: 0 0 0 1  Comment - - - slide out of bed  Injury with Fall? 0 0 0 0  Risk for fall due to : - - - History of fall(s)    Vitals:   09/07/19 1311  BP: 122/62  Pulse: 74  Resp: 16  Temp: 97.7 F (36.5 C)  SpO2: 91%  Weight: 115 lb 6.4 oz (52.3 kg)  Height: 5\' 1"  (1.549 m)   Body mass index is 21.8 kg/m. Physical Exam Vitals reviewed.  Constitutional:      General: She is not in acute distress.    Appearance: She is not ill-appearing.  HENT:     Head: Normocephalic.  Right Ear: Tympanic membrane, ear canal and external ear normal. There is no impacted cerumen.     Left Ear: Tympanic membrane, ear canal and external ear normal. There is no impacted cerumen.     Mouth/Throat:     Mouth: Mucous membranes are moist.     Pharynx: Oropharynx is clear. No oropharyngeal exudate or posterior oropharyngeal erythema.  Eyes:     General: No scleral icterus.       Right eye: No discharge.        Left eye: No discharge.     Conjunctiva/sclera: Conjunctivae normal.     Pupils: Pupils are equal, round, and reactive to light.  Cardiovascular:     Rate and Rhythm: Normal rate and regular rhythm.     Pulses: Normal pulses.     Heart sounds: Murmur heard.  No friction rub. No gallop.   Pulmonary:     Effort: Pulmonary effort is normal. No respiratory distress.     Breath sounds: Normal breath sounds. No wheezing, rhonchi or rales.  Chest:     Chest wall: No tenderness.  Abdominal:     General: Bowel sounds are normal. There is no distension.     Palpations: Abdomen is soft. There is no mass.     Tenderness: There is no abdominal tenderness. There is no  right CVA tenderness, left CVA tenderness, guarding or rebound.     Comments: Palpable grape size lump over umbilicus 12 'O'clock area.none tender to palpation.   Musculoskeletal:        General: No swelling or tenderness.     Right lower leg: No edema.     Left lower leg: No edema.     Comments: Unsteady gait ambulate with Rolator  Skin:    General: Skin is warm and dry.     Coloration: Skin is not pale.     Findings: No erythema.  Neurological:     Mental Status: She is alert and oriented to person, place, and time.     Cranial Nerves: No cranial nerve deficit.     Motor: No weakness.     Gait: Gait abnormal.  Psychiatric:        Mood and Affect: Mood normal.        Behavior: Behavior normal.        Thought Content: Thought content normal.        Judgment: Judgment normal.    Labs reviewed: Recent Labs    05/01/19 0810 06/01/19 0907 07/13/19 1542  NA 138  --  139  K 3.6  --  3.8  CL 101  --  102  CO2 27  --  27  GLUCOSE 155*  --  74  BUN 20  --  31*  CREATININE 0.81  --  0.85  CALCIUM 9.6 10.4 10.1  MG  --  1.6  --   PHOS  --  2.4  --    Recent Labs    05/01/19 0810  AST 14  ALT 12  BILITOT 0.6  PROT 6.6   Recent Labs    05/01/19 0810  WBC 6.6  NEUTROABS 4,448  HGB 10.6*  HCT 33.3*  MCV 87.2  PLT 160   Lab Results  Component Value Date   TSH 1.75 05/01/2019   Lab Results  Component Value Date   HGBA1C 6.6 (H) 05/01/2019   Lab Results  Component Value Date   CHOL 147 05/01/2019   HDL 56 05/01/2019   LDLCALC 75 05/01/2019  TRIG 84 05/01/2019   CHOLHDL 2.6 05/01/2019    Significant Diagnostic Results in last 30 days:  No results found.  Assessment/Plan  Symptoms of urinary tract infection Reports increase urine frequency and burning with urination.Afebrile,no abdominal pain or back pain  - POC Urinalysis Dipstick urine cloudy with positive protein,large blood and Leukocytes.Discussed option to start on antibiotics  But will change if  urine cultures indicate a different antibiotics.she would like to wait for final urine culture for now.will notify provider if symptoms worsen or running any fever.  - encouraged to increase water intake - start on cranberry tablet twice daily.  -  Urine culture    Family/ staff Communication: Reviewed plan of care with patient and care giver.  Labs/tests ordered: Urine culture    Next Appointment: as needed if symptoms worsen or fail to improve   Caesar Bookman, NP

## 2019-09-07 NOTE — Patient Instructions (Signed)
- Increase water intake  - Notify provide if symptoms worsen or running any fever,chills    Urinary Tract Infection, Adult A urinary tract infection (UTI) is an infection of any part of the urinary tract. The urinary tract includes:  The kidneys.  The ureters.  The bladder.  The urethra. These organs make, store, and get rid of pee (urine) in the body. What are the causes? This is caused by germs (bacteria) in your genital area. These germs grow and cause swelling (inflammation) of your urinary tract. What increases the risk? You are more likely to develop this condition if:  You have a small, thin tube (catheter) to drain pee.  You cannot control when you pee or poop (incontinence).  You are female, and: ? You use these methods to prevent pregnancy:  A medicine that kills sperm (spermicide).  A device that blocks sperm (diaphragm). ? You have low levels of a female hormone (estrogen). ? You are pregnant.  You have genes that add to your risk.  You are sexually active.  You take antibiotic medicines.  You have trouble peeing because of: ? A prostate that is bigger than normal, if you are female. ? A blockage in the part of your body that drains pee from the bladder (urethra). ? A kidney stone. ? A nerve condition that affects your bladder (neurogenic bladder). ? Not getting enough to drink. ? Not peeing often enough.  You have other conditions, such as: ? Diabetes. ? A weak disease-fighting system (immune system). ? Sickle cell disease. ? Gout. ? Injury of the spine. What are the signs or symptoms? Symptoms of this condition include:  Needing to pee right away (urgently).  Peeing often.  Peeing small amounts often.  Pain or burning when peeing.  Blood in the pee.  Pee that smells bad or not like normal.  Trouble peeing.  Pee that is cloudy.  Fluid coming from the vagina, if you are female.  Pain in the belly or lower back. Other symptoms  include:  Throwing up (vomiting).  No urge to eat.  Feeling mixed up (confused).  Being tired and grouchy (irritable).  A fever.  Watery poop (diarrhea). How is this treated? This condition may be treated with:  Antibiotic medicine.  Other medicines.  Drinking enough water. Follow these instructions at home:  Medicines  Take over-the-counter and prescription medicines only as told by your doctor.  If you were prescribed an antibiotic medicine, take it as told by your doctor. Do not stop taking it even if you start to feel better. General instructions  Make sure you: ? Pee until your bladder is empty. ? Do not hold pee for a long time. ? Empty your bladder after sex. ? Wipe from front to back after pooping if you are a female. Use each tissue one time when you wipe.  Drink enough fluid to keep your pee pale yellow.  Keep all follow-up visits as told by your doctor. This is important. Contact a doctor if:  You do not get better after 1-2 days.  Your symptoms go away and then come back. Get help right away if:  You have very bad back pain.  You have very bad pain in your lower belly.  You have a fever.  You are sick to your stomach (nauseous).  You are throwing up. Summary  A urinary tract infection (UTI) is an infection of any part of the urinary tract.  This condition is caused by germs in  your genital area.  There are many risk factors for a UTI. These include having a small, thin tube to drain pee and not being able to control when you pee or poop.  Treatment includes antibiotic medicines for germs.  Drink enough fluid to keep your pee pale yellow. This information is not intended to replace advice given to you by your health care provider. Make sure you discuss any questions you have with your health care provider. Document Revised: 12/15/2017 Document Reviewed: 07/07/2017 Elsevier Patient Education  2020 [employer].

## 2019-09-09 LAB — URINE CULTURE
MICRO NUMBER:: 10885577
SPECIMEN QUALITY:: ADEQUATE

## 2019-09-11 ENCOUNTER — Telehealth: Payer: Self-pay | Admitting: *Deleted

## 2019-09-11 ENCOUNTER — Other Ambulatory Visit: Payer: Self-pay

## 2019-09-11 DIAGNOSIS — R399 Unspecified symptoms and signs involving the genitourinary system: Secondary | ICD-10-CM

## 2019-09-11 MED ORDER — SACCHAROMYCES BOULARDII 250 MG PO CAPS
250.0000 mg | ORAL_CAPSULE | Freq: Two times a day (BID) | ORAL | 0 refills | Status: DC
Start: 2019-09-11 — End: 2019-10-29

## 2019-09-11 MED ORDER — CIPROFLOXACIN HCL 500 MG PO TABS
500.0000 mg | ORAL_TABLET | Freq: Two times a day (BID) | ORAL | 0 refills | Status: DC
Start: 2019-09-11 — End: 2019-10-29

## 2019-09-11 NOTE — Telephone Encounter (Signed)
Start on Cipro 500 mg tablet one by mouth twice daily x 7 days but it would be good to culture urine to make sure infection is susceptible to Cipro to avoid recurrence of urinary tract infection. Take along with Probiotics Florastor 250 mg capsule one by mouth twice daily x 10 days to prevent antibiotics associated diarrhea. - increase water intake  - may use preparation H suppository for hemorrhoids daily as needed

## 2019-09-11 NOTE — Telephone Encounter (Signed)
1.) Patient daughter, Alexia Freestone called and stated that they have to recollect a urine. Stated that patient was seen 09/07/19 and having same symptoms. Daughter is not wanting patient to have to wait before getting treated and wants to know if you will go ahead and call in an antibiotic.   2.) Stated patient has hemorrhoids and wants to know what they could get OTC for this.   Please Advise.    09/07/2019 OV NOTE:  Assessment/Plan  Symptoms of urinary tract infection Reports increase urine frequency and burning with urination.Afebrile,no abdominal pain or back pain  - POC Urinalysis Dipstick urine cloudy with positive protein,large blood and Leukocytes.Discussed option to start on antibiotics  But will change if urine cultures indicate a different antibiotics.she would like to wait for final urine culture for now.will notify provider if symptoms worsen or running any fever.  - encouraged to increase water intake - start on cranberry tablet twice daily.  -  Urine culture

## 2019-09-11 NOTE — Telephone Encounter (Signed)
Patient daughter notified and agreed.  Pended Rx's and sent to [facility] for approval due to HIGH ALERT Warning.

## 2019-09-11 NOTE — Telephone Encounter (Signed)
Cipro script send to pharmacy.  

## 2019-09-12 ENCOUNTER — Other Ambulatory Visit: Payer: Self-pay

## 2019-09-19 ENCOUNTER — Other Ambulatory Visit: Payer: Medicare Other

## 2019-09-19 ENCOUNTER — Other Ambulatory Visit: Payer: Self-pay

## 2019-09-19 DIAGNOSIS — R399 Unspecified symptoms and signs involving the genitourinary system: Secondary | ICD-10-CM | POA: Diagnosis not present

## 2019-09-21 ENCOUNTER — Other Ambulatory Visit: Payer: Medicare Other

## 2019-09-21 ENCOUNTER — Telehealth: Payer: Self-pay | Admitting: Physician Assistant

## 2019-09-21 LAB — URINE CULTURE
MICRO NUMBER:: 10929180
SPECIMEN QUALITY:: ADEQUATE

## 2019-09-21 MED ORDER — PANTOPRAZOLE SODIUM 40 MG PO TBEC
40.0000 mg | DELAYED_RELEASE_TABLET | Freq: Two times a day (BID) | ORAL | 3 refills | Status: DC
Start: 2019-09-21 — End: 2020-01-23

## 2019-09-21 NOTE — Telephone Encounter (Signed)
Sent script to pharmacy. 

## 2019-09-21 NOTE — Telephone Encounter (Signed)
Requesting refill on Pantoprazole 

## 2019-10-22 ENCOUNTER — Ambulatory Visit: Payer: Medicare Other | Admitting: Nurse Practitioner

## 2019-10-29 ENCOUNTER — Ambulatory Visit (INDEPENDENT_AMBULATORY_CARE_PROVIDER_SITE_OTHER): Payer: Medicare Other | Admitting: Nurse Practitioner

## 2019-10-29 ENCOUNTER — Other Ambulatory Visit: Payer: Self-pay

## 2019-10-29 ENCOUNTER — Encounter: Payer: Self-pay | Admitting: Nurse Practitioner

## 2019-10-29 VITALS — BP 116/70 | HR 71 | Temp 96.8°F | Ht 61.0 in | Wt 113.0 lb

## 2019-10-29 DIAGNOSIS — Z23 Encounter for immunization: Secondary | ICD-10-CM | POA: Diagnosis not present

## 2019-10-29 DIAGNOSIS — N3281 Overactive bladder: Secondary | ICD-10-CM | POA: Diagnosis not present

## 2019-10-29 DIAGNOSIS — E1142 Type 2 diabetes mellitus with diabetic polyneuropathy: Secondary | ICD-10-CM | POA: Diagnosis not present

## 2019-10-29 DIAGNOSIS — D509 Iron deficiency anemia, unspecified: Secondary | ICD-10-CM | POA: Diagnosis not present

## 2019-10-29 DIAGNOSIS — K219 Gastro-esophageal reflux disease without esophagitis: Secondary | ICD-10-CM

## 2019-10-29 DIAGNOSIS — I10 Essential (primary) hypertension: Secondary | ICD-10-CM | POA: Diagnosis not present

## 2019-10-29 DIAGNOSIS — Z111 Encounter for screening for respiratory tuberculosis: Secondary | ICD-10-CM

## 2019-10-29 DIAGNOSIS — R634 Abnormal weight loss: Secondary | ICD-10-CM

## 2019-10-29 DIAGNOSIS — N898 Other specified noninflammatory disorders of vagina: Secondary | ICD-10-CM

## 2019-10-29 MED ORDER — MIRABEGRON ER 25 MG PO TB24: 25.0000 mg | ORAL_TABLET | Freq: Every day | ORAL | 1 refills | Status: DC

## 2019-10-29 NOTE — Progress Notes (Signed)
Careteam: Patient Care Team: Sharon Seller, NP as PCP - General (Geriatric Medicine) Rollene Rotunda, MD as PCP - Cardiology (Cardiology)  PLACE OF SERVICE:  [facility]  Advanced Directive information    Allergies  Allergen Reactions  . Ampicillin Rash  . Penicillins Rash    Has patient had a PCN reaction causing immediate rash, facial/tongue/throat swelling, SOB or lightheadedness with hypotension: Yes Has patient had a PCN reaction causing severe rash involving mucus membranes or skin necrosis: No Has patient had a PCN reaction that required hospitalization: No Has patient had a PCN reaction occurring within the last 10 years: No If all of the above answers are "NO", then may proceed with Cephalosporin use.   . Sulfur Rash    Chief Complaint  Patient presents with  . Medical Management of Chronic Issues    4 month follow-up, flu vaccine, TB skin test, and complete FL2. Patient c/o frequent urination. Patient c/o premarin cream not helping. Discuss need for MVI.      HPI: Patient is a 86 y.o. female for follow up Pt with hx of AS no current symptoms. Followed with cardiology in august but would not want any therapy/intervention and therefor no further no imaging was done. She will follow up with cardiology in 2 years.   She has a walker due to balance issues, OA and debility.  htn- controlled on losartan, coreg  DM- A1c 6.6 on januvia daily, amaryl twice daily  And metformin twice daily no low blood sugars.   GERD- maintained on protonix 40 mg daily   Hyperlipidemia- on lipitor daily   Does not wish to have bone density- does not want any additional intervention.  Weight loss- eating light. "like a bird"  Plans to go to [place] to stay   OAB- ongoing, very bothersome with frequency of urination.  Review of Systems:  Review of Systems  Constitutional: Negative for chills, fever and weight loss.  HENT: Negative for tinnitus.   Respiratory: Negative  for cough, sputum production and shortness of breath.   Cardiovascular: Negative for chest pain, palpitations and leg swelling.  Gastrointestinal: Negative for abdominal pain, constipation, diarrhea and heartburn.  Genitourinary: Positive for frequency. Negative for dysuria and urgency.  Musculoskeletal: Negative for back pain, falls, joint pain and myalgias.  Skin: Negative.   Neurological: Negative for dizziness and headaches.  Psychiatric/Behavioral: Negative for depression and memory loss. The patient does not have insomnia.     Past Medical History:  Diagnosis Date  . Anemia   . Blindness   . Diabetes mellitus without complication (HCC)   . Disorder of phosphorus metabolism   . GERD (gastroesophageal reflux disease)   . High blood pressure   . Hypercalcemia   . Macular degeneration   . Neuropathy   . Non-rheumatic aortic stenosis   . Osteoporosis    Past Surgical History:  Procedure Laterality Date  . APPENDECTOMY  1953  . CATARACT EXTRACTION Right 11/17/1982   Dr. Loney Hering - Model 108B / 15D  . CATARACT EXTRACTION Left 12/11/1991   Dr. [place] - Model [id number] / 14.5D  . COLONOSCOPY    . TONSILLECTOMY  1939   Social History:   reports that she has never smoked. She has never used smokeless tobacco. She reports previous alcohol use. She reports that she does not use drugs.  Family History  Problem Relation Age of Onset  . Psoriasis Father        Of the liver   . Osteoporosis  Sister   . Lung cancer Brother   . Osteoporosis Sister   . Colon cancer Neg Hx   . Stomach cancer Neg Hx   . Esophageal cancer Neg Hx   . Pancreatic cancer Neg Hx   . Hyperparathyroidism Neg Hx     Medications: Patient's Medications  New Prescriptions   No medications on file  Previous Medications   ASCORBIC ACID (VITAMIN C) 125 MG CHEW    Chew by mouth.   ATORVASTATIN (LIPITOR) 20 MG TABLET    Take 1 tablet (20 mg total) by mouth daily.   CARBOXYMETHYLCELLULOSE SODIUM (EYE DROPS OP)     Apply to eye.   CARVEDILOL (COREG) 3.125 MG TABLET    Take 1 tablet (3.125 mg total) by mouth 2 (two) times daily with a meal.   CONJUGATED ESTROGENS (PREMARIN) VAGINAL CREAM    One application three time per week   CRANBERRY SOFT PO    Take 1 tablet by mouth daily. Gummy   DOCUSATE SODIUM (COLACE) 100 MG CAPSULE    Take 100 mg by mouth every other day.   FERROUS SULFATE 325 (65 FE) MG EC TABLET    Take 1 tablet (325 mg total) by mouth in the morning and at bedtime.   GLIMEPIRIDE (AMARYL) 4 MG TABLET    Take 1 tablet (4 mg total) by mouth in the morning and at bedtime.   GLUCOSE BLOOD TEST STRIP    Check blood sugar twice daily as directed E11.9   LANCETS (ONETOUCH ULTRASOFT) LANCETS    Check blood sugar twice daily DX E11.9   LORATADINE (CLARITIN) 10 MG TABLET    Take 1 tablet (10 mg total) by mouth daily.   LOSARTAN (COZAAR) 25 MG TABLET    Take 1 tablet (25 mg total) by mouth daily.   METFORMIN (GLUCOPHAGE) 500 MG TABLET    Take 1 tablet (500 mg total) by mouth 2 (two) times daily with a meal.   PANTOPRAZOLE (PROTONIX) 40 MG TABLET    Take 1 tablet (40 mg total) by mouth 2 (two) times daily before a meal.   PSYLLIUM (METAMUCIL) 58.6 % PACKET    Take 1 packet by mouth daily.   SALINE NASAL MIST NA    Place 1 spray into the nose as needed.    SITAGLIPTIN (JANUVIA) 100 MG TABLET    Take 1 tablet (100 mg total) by mouth daily.  Modified Medications   No medications on file  Discontinued Medications   CIPROFLOXACIN (CIPRO) 500 MG TABLET    Take 1 tablet (500 mg total) by mouth 2 (two) times daily. For 7 days   MULTIPLE VITAMIN (MULTIVITAMIN ADULT PO)    Take by mouth.   SACCHAROMYCES BOULARDII (FLORASTOR) 250 MG CAPSULE    Take 1 capsule (250 mg total) by mouth 2 (two) times daily. For 10 days    Physical Exam:  Vitals:   10/29/19 1121  BP: 116/70  Pulse: 71  Temp: (!) 96.8 F (36 C)  TempSrc: Temporal  SpO2: 96%  Weight: 113 lb (51.3 kg)  Height: 5\' 1"  (1.549 m)   Body mass index  is 21.35 kg/m. Wt Readings from Last 3 Encounters:  10/29/19 113 lb (51.3 kg)  09/07/19 115 lb 6.4 oz (52.3 kg)  08/13/19 115 lb 12.8 oz (52.5 kg)    Physical Exam Constitutional:      General: She is not in acute distress.    Appearance: She is well-developed. She is not diaphoretic.  HENT:  Head: Normocephalic and atraumatic.  Eyes:     Conjunctiva/sclera: Conjunctivae normal.     Pupils: Pupils are equal, round, and reactive to light.  Cardiovascular:     Rate and Rhythm: Normal rate and regular rhythm.     Heart sounds: Normal heart sounds.  Pulmonary:     Effort: Pulmonary effort is normal.     Breath sounds: Normal breath sounds.  Abdominal:     General: Bowel sounds are normal.     Palpations: Abdomen is soft.  Musculoskeletal:        General: No tenderness.     Cervical back: Normal range of motion and neck supple.  Skin:    General: Skin is warm and dry.  Neurological:     Mental Status: She is alert and oriented to person, place, and time.     Gait: Gait abnormal (slow with gait).     Labs reviewed: Basic Metabolic Panel: Recent Labs    05/01/19 0810 06/01/19 0907 07/13/19 1542  NA 138  --  139  K 3.6  --  3.8  CL 101  --  102  CO2 27  --  27  GLUCOSE 155*  --  74  BUN 20  --  31*  CREATININE 0.81  --  0.85  CALCIUM 9.6 10.4 10.1  MG  --  1.6  --   PHOS  --  2.4  --   TSH 1.75  --   --    Liver Function Tests: Recent Labs    05/01/19 0810  AST 14  ALT 12  BILITOT 0.6  PROT 6.6   No results for input(s): LIPASE, AMYLASE in the last 8760 hours. No results for input(s): AMMONIA in the last 8760 hours. CBC: Recent Labs    05/01/19 0810  WBC 6.6  NEUTROABS 4,448  HGB 10.6*  HCT 33.3*  MCV 87.2  PLT 160   Lipid Panel: Recent Labs    05/01/19 0810  CHOL 147  HDL 56  LDLCALC 75  TRIG 84  CHOLHDL 2.6   TSH: Recent Labs    05/01/19 0810  TSH 1.75   A1C: Lab Results  Component Value Date   HGBA1C 6.6 (H) 05/01/2019       Assessment/Plan 1. Need for influenza vaccination - Flu Vaccine QUAD High Dose(Fluad)  2. Overactive bladder Ongoing, effects her quality of life with the increase trips to the bathroom. She has been tested for UTI in the past that have been negative. progressively getting worse.  - mirabegron ER (MYRBETRIQ) 25 MG TB24 tablet; Take 1 tablet (25 mg total) by mouth daily.  Dispense: 30 tablet; Refill: 1  3. Screening-pulmonary TB - QuantiFERON-TB Gold Plus  4. Type 2 diabetes mellitus with diabetic polyneuropathy, without long-term current use of insulin (HCC) Controlled on current regimen, no low blood sugars despite eating less, consider dose reduction once A1c results.  - Hemoglobin A1c  5. Weight loss Noted, to increase protein in diet, can use supplement daily at smallest meal.   6. Vaginal dryness Will continue premarin cream for now, may try to DC at a later date and see if there is any difference.   7. Gastroesophageal reflux disease, unspecified whether esophagitis present Controlled with lifestyle modifications and protonix.   8. Essential hypertension -controlled on current regimen, continue medications with lifestyle modifications.  - CBC with Differential/Platelet - COMPLETE METABOLIC PANEL WITH GFR  9. Iron deficiency anemia, unspecified iron deficiency anemia type -continues on supplement.  - CBC  with Differential/Platelet   Next appt: 6 months, sooner if needed  Rikia Sukhu K. Biagio Borg  [facility] [phone]

## 2019-10-31 LAB — HEMOGLOBIN A1C
Hgb A1c MFr Bld: 5.7 % of total Hgb — ABNORMAL HIGH (ref <5.7)
Mean Plasma Glucose: 117 (calc)
eAG (mmol/L): 6.5 (calc)

## 2019-10-31 LAB — CBC WITH DIFFERENTIAL/PLATELET
Absolute Monocytes: 478 cells/uL (ref 200–950)
Basophils Absolute: 18 cells/uL (ref 0–200)
Basophils Relative: 0.3 %
Eosinophils Absolute: 83 cells/uL (ref 15–500)
Eosinophils Relative: 1.4 %
HCT: 32.9 % — ABNORMAL LOW (ref 35.0–45.0)
Hemoglobin: 10.3 g/dL — ABNORMAL LOW (ref 11.7–15.5)
Lymphs Abs: 1192 cells/uL (ref 850–3900)
MCH: 29.3 pg (ref 27.0–33.0)
MCHC: 31.3 g/dL — ABNORMAL LOW (ref 32.0–36.0)
MCV: 93.5 fL (ref 80.0–100.0)
MPV: 12.9 fL — ABNORMAL HIGH (ref 7.5–12.5)
Monocytes Relative: 8.1 %
Neutro Abs: 4130 cells/uL (ref 1500–7800)
Neutrophils Relative %: 70 %
Platelets: 170 10*3/uL (ref 140–400)
RBC: 3.52 10*6/uL — ABNORMAL LOW (ref 3.80–5.10)
RDW: 12.5 % (ref 11.0–15.0)
Total Lymphocyte: 20.2 %
WBC: 5.9 10*3/uL (ref 3.8–10.8)

## 2019-10-31 LAB — QUANTIFERON-TB GOLD PLUS
Mitogen-NIL: >10 IU/mL
NIL: 0.06 IU/mL
QuantiFERON-TB Gold Plus: NEGATIVE
TB1-NIL: <0 IU/mL
TB2-NIL: <0 IU/mL

## 2019-10-31 LAB — COMPLETE METABOLIC PANEL WITH GFR
AG Ratio: 1.5 (calc) (ref 1.0–2.5)
ALT: 11 U/L (ref 6–29)
AST: 14 U/L (ref 10–35)
Albumin: 4.1 g/dL (ref 3.6–5.1)
Alkaline phosphatase (APISO): 53 U/L (ref 37–153)
BUN/Creatinine Ratio: 42 (calc) — ABNORMAL HIGH (ref 6–22)
BUN: 43 mg/dL — ABNORMAL HIGH (ref 7–25)
CO2: 22 mmol/L (ref 20–32)
Calcium: 10.1 mg/dL (ref 8.6–10.4)
Chloride: 104 mmol/L (ref 98–110)
Creat: 1.02 mg/dL — ABNORMAL HIGH (ref 0.60–0.88)
GFR, Est African American: 56 mL/min/{1.73_m2} — ABNORMAL LOW (ref >=60)
GFR, Est Non African American: 48 mL/min/{1.73_m2} — ABNORMAL LOW (ref >=60)
Globulin: 2.8 g/dL (calc) (ref 1.9–3.7)
Glucose, Bld: 135 mg/dL (ref 65–139)
Potassium: 3.9 mmol/L (ref 3.5–5.3)
Sodium: 138 mmol/L (ref 135–146)
Total Bilirubin: 0.4 mg/dL (ref 0.2–1.2)
Total Protein: 6.9 g/dL (ref 6.1–8.1)

## 2019-11-01 ENCOUNTER — Telehealth: Payer: Self-pay

## 2019-11-01 NOTE — Telephone Encounter (Signed)
Stacey Ritter with [employer] called to check the status of paperwork faxed yesterday.  I informed Stacey Ritter paperwork was received, Sharon Seller, NP will complete and submit to her tomorrow when in office (offsite on Thursday).  Stacey Ritter verbalized understanding.

## 2019-11-12 ENCOUNTER — Other Ambulatory Visit: Payer: Self-pay | Admitting: Nurse Practitioner

## 2019-11-12 DIAGNOSIS — I10 Essential (primary) hypertension: Secondary | ICD-10-CM

## 2019-11-12 DIAGNOSIS — E1142 Type 2 diabetes mellitus with diabetic polyneuropathy: Secondary | ICD-10-CM

## 2019-11-20 ENCOUNTER — Telehealth: Payer: Self-pay

## 2019-11-20 NOTE — Telephone Encounter (Signed)
[facility] called and stated the the [facility] [employer] form for Stacey Ritter can not be read because the font is to small Melissa at [phone] please advise

## 2019-11-20 NOTE — Telephone Encounter (Addendum)
The number provided for Efraim Kaufmann is a fax number. Per verbal conversation with Westley Hummer, Melissa's telephone number did not come across the caller ID and was not recorded or confirmed verbally.   I called Victorino Dike who also works with [employer] (got number from a previous message in October 2021) and left a detailed message requesting a return call, to see how they would like to go about receiving a copy of the completed FL2. We can mail, someone can pick-up, or we can send to a business email.   I requested a return called to advise

## 2019-11-21 ENCOUNTER — Other Ambulatory Visit: Payer: Self-pay | Admitting: Nurse Practitioner

## 2019-11-22 ENCOUNTER — Telehealth: Payer: Self-pay

## 2019-11-22 NOTE — Telephone Encounter (Signed)
Form was received via fax, emailed to Sharon Seller, NP, and I placed call to Shanda Bumps to stress the urgency.  Shanda Bumps printed, signed, and submitted back to me via email.  I printed email, faxed to [facility] at [facility] [phone]). I called Jenifer to inform her that fax was sent. Jenifer had not received fax at the time of call as it was immediately following the fax. Jenifer aware to call if she has additional questions or concerns.

## 2019-11-22 NOTE — Telephone Encounter (Signed)
Incoming call received from [place] requesting a signature from [place] on a revised FL2 form.  Patients family was instructed to have Jessica sign a revised FL2 form (revised by facility) by today (not done) and now this is URGENT as the patient will not be able to enter the facility until this is signed.   I informed Jenifer that Shanda Bumps was offsite today and I will have to email form to her, wait for her to sign, email back to me, print, and fax back. Jenifer asked that this be done ASAP and asked for a timeframe.  I informed Jenifer that I will stress the urgency and it could take up to an hour for Shanda Bumps is seeing patients at an offsite location.

## 2019-11-23 ENCOUNTER — Ambulatory Visit: Payer: Medicare Other | Admitting: Nurse Practitioner

## 2019-12-11 ENCOUNTER — Other Ambulatory Visit: Payer: Self-pay | Admitting: Nurse Practitioner

## 2019-12-11 DIAGNOSIS — N3281 Overactive bladder: Secondary | ICD-10-CM

## 2020-01-01 ENCOUNTER — Encounter: Payer: Self-pay | Admitting: Nurse Practitioner

## 2020-01-01 ENCOUNTER — Telehealth: Payer: Self-pay

## 2020-01-01 ENCOUNTER — Other Ambulatory Visit: Payer: Self-pay

## 2020-01-01 ENCOUNTER — Ambulatory Visit (INDEPENDENT_AMBULATORY_CARE_PROVIDER_SITE_OTHER): Payer: Medicare Other | Admitting: Nurse Practitioner

## 2020-01-01 DIAGNOSIS — Z0000 Encounter for general adult medical examination without abnormal findings: Secondary | ICD-10-CM | POA: Diagnosis not present

## 2020-01-01 NOTE — Telephone Encounter (Signed)
[name], liggins are scheduled for a virtual visit with your provider today.    Just as we do with appointments in the office, we must obtain your consent to participate.  Your consent will be active for this visit and any virtual visit you may have with one of our providers in the next 365 days.    If you have a MyChart account, I can also send a copy of this consent to you electronically.  All virtual visits are billed to your insurance company just like a traditional visit in the office.  As this is a virtual visit, video technology does not allow for your provider to perform a traditional examination.  This may limit your provider's ability to fully assess your condition.  If your provider identifies any concerns that need to be evaluated in person or the need to arrange testing such as labs, EKG, etc, we will make arrangements to do so.    Although advances in technology are sophisticated, we cannot ensure that it will always work on either your end or our end.  If the connection with a video visit is poor, we may have to switch to a telephone visit.  With either a video or telephone visit, we are not always able to ensure that we have a secure connection.   I need to obtain your verbal consent now.   Are you willing to proceed with your visit today?   Stacey Ritter has provided verbal consent on 01/01/2020 for a virtual visit (video or telephone).   Elveria Royals, CMA 01/01/2020  11:24 AM

## 2020-01-01 NOTE — Patient Instructions (Signed)
[name] , Thank you for taking time to come for your Medicare Wellness Visit. I appreciate your ongoing commitment to your health goals. Please review the following plan we discussed and let me know if I can assist you in the future.   Screening recommendations/referrals: Colonoscopy aged out Mammogram aged out Bone Density- you have declined  Recommended yearly ophthalmology/optometry visit for glaucoma screening and checkup Recommended yearly dental visit for hygiene and checkup  Vaccinations: Influenza vaccine up to date Pneumococcal vaccine up to date Tdap vaccine RECOMMENDED_ to get at the local pharmacy Shingles vaccine RECOMMENDED_ to get at the local pharmacy    Advanced directives: on file.   Conditions/risks identified: fall risk, advance age, and complications related to diabetes.   Next appointment: 1 year   Preventive Care 22 Years and Older, Female Preventive care refers to lifestyle choices and visits with your health care provider that can promote health and wellness. What does preventive care include?  A yearly physical exam. This is also called an annual well check.  Dental exams once or twice a year.  Routine eye exams. Ask your health care provider how often you should have your eyes checked.  Personal lifestyle choices, including:  Daily care of your teeth and gums.  Regular physical activity.  Eating a healthy diet.  Avoiding tobacco and drug use.  Limiting alcohol use.  Practicing safe sex.  Taking low-dose aspirin every day.  Taking vitamin and mineral supplements as recommended by your health care provider. What happens during an annual well check? The services and screenings done by your health care provider during your annual well check will depend on your age, overall health, lifestyle risk factors, and family history of disease. Counseling  Your health care provider may ask you questions about your:  Alcohol use.  Tobacco  use.  Drug use.  Emotional well-being.  Home and relationship well-being.  Sexual activity.  Eating habits.  History of falls.  Memory and ability to understand (cognition).  Work and work Astronomer.  Reproductive health. Screening  You may have the following tests or measurements:  Height, weight, and BMI.  Blood pressure.  Lipid and cholesterol levels. These may be checked every 5 years, or more frequently if you are over 83 years old.  Skin check.  Lung cancer screening. You may have this screening every year starting at age 100 if you have a 30-pack-year history of smoking and currently smoke or have quit within the past 15 years.  Fecal occult blood test (FOBT) of the stool. You may have this test every year starting at age 60.  Flexible sigmoidoscopy or colonoscopy. You may have a sigmoidoscopy every 5 years or a colonoscopy every 10 years starting at age 64.  Hepatitis C blood test.  Hepatitis B blood test.  Sexually transmitted disease (STD) testing.  Diabetes screening. This is done by checking your blood sugar (glucose) after you have not eaten for a while (fasting). You may have this done every 1-3 years.  Bone density scan. This is done to screen for osteoporosis. You may have this done starting at age 22.  Mammogram. This may be done every 1-2 years. Talk to your health care provider about how often you should have regular mammograms. Talk with your health care provider about your test results, treatment options, and if necessary, the need for more tests. Vaccines  Your health care provider may recommend certain vaccines, such as:  Influenza vaccine. This is recommended every year.  Tetanus,  diphtheria, and acellular pertussis (Tdap, Td) vaccine. You may need a Td booster every 10 years.  Zoster vaccine. You may need this after age 18.  Pneumococcal 13-valent conjugate (PCV13) vaccine. One dose is recommended after age 56.  Pneumococcal  polysaccharide (PPSV23) vaccine. One dose is recommended after age 36. Talk to your health care provider about which screenings and vaccines you need and how often you need them. This information is not intended to replace advice given to you by your health care provider. Make sure you discuss any questions you have with your health care provider. Document Released: 01/24/2015 Document Revised: 09/17/2015 Document Reviewed: 10/29/2014 Elsevier Interactive Patient Education  2017 [employer] Prevention in the Home Falls can cause injuries. They can happen to people of all ages. There are many things you can do to make your home safe and to help prevent falls. What can I do on the outside of my home?  Regularly fix the edges of walkways and driveways and fix any cracks.  Remove anything that might make you trip as you walk through a door, such as a raised step or threshold.  Trim any bushes or trees on the path to your home.  Use bright outdoor lighting.  Clear any walking paths of anything that might make someone trip, such as rocks or tools.  Regularly check to see if handrails are loose or broken. Make sure that both sides of any steps have handrails.  Any raised decks and porches should have guardrails on the edges.  Have any leaves, snow, or ice cleared regularly.  Use sand or salt on walking paths during winter.  Clean up any spills in your garage right away. This includes oil or grease spills. What can I do in the bathroom?  Use night lights.  Install grab bars by the toilet and in the tub and shower. Do not use towel bars as grab bars.  Use non-skid mats or decals in the tub or shower.  If you need to sit down in the shower, use a plastic, non-slip stool.  Keep the floor dry. Clean up any water that spills on the floor as soon as it happens.  Remove soap buildup in the tub or shower regularly.  Attach bath mats securely with double-sided non-slip rug  tape.  Do not have throw rugs and other things on the floor that can make you trip. What can I do in the bedroom?  Use night lights.  Make sure that you have a light by your bed that is easy to reach.  Do not use any sheets or blankets that are too big for your bed. They should not hang down onto the floor.  Have a firm chair that has side arms. You can use this for support while you get dressed.  Do not have throw rugs and other things on the floor that can make you trip. What can I do in the kitchen?  Clean up any spills right away.  Avoid walking on wet floors.  Keep items that you use a lot in easy-to-reach places.  If you need to reach something above you, use a strong step stool that has a grab bar.  Keep electrical cords out of the way.  Do not use floor polish or wax that makes floors slippery. If you must use wax, use non-skid floor wax.  Do not have throw rugs and other things on the floor that can make you trip. What can I do with  my stairs?  Do not leave any items on the stairs.  Make sure that there are handrails on both sides of the stairs and use them. Fix handrails that are broken or loose. Make sure that handrails are as long as the stairways.  Check any carpeting to make sure that it is firmly attached to the stairs. Fix any carpet that is loose or worn.  Avoid having throw rugs at the top or bottom of the stairs. If you do have throw rugs, attach them to the floor with carpet tape.  Make sure that you have a light switch at the top of the stairs and the bottom of the stairs. If you do not have them, ask someone to add them for you. What else can I do to help prevent falls?  Wear shoes that:  Do not have high heels.  Have rubber bottoms.  Are comfortable and fit you well.  Are closed at the toe. Do not wear sandals.  If you use a stepladder:  Make sure that it is fully opened. Do not climb a closed stepladder.  Make sure that both sides of the  stepladder are locked into place.  Ask someone to hold it for you, if possible.  Clearly mark and make sure that you can see:  Any grab bars or handrails.  First and last steps.  Where the edge of each step is.  Use tools that help you move around (mobility aids) if they are needed. These include:  Canes.  Walkers.  Scooters.  Crutches.  Turn on the lights when you go into a dark area. Replace any light bulbs as soon as they burn out.  Set up your furniture so you have a clear path. Avoid moving your furniture around.  If any of your floors are uneven, fix them.  If there are any pets around you, be aware of where they are.  Review your medicines with your doctor. Some medicines can make you feel dizzy. This can increase your chance of falling. Ask your doctor what other things that you can do to help prevent falls. This information is not intended to replace advice given to you by your health care provider. Make sure you discuss any questions you have with your health care provider. Document Released: 10/24/2008 Document Revised: 06/05/2015 Document Reviewed: 02/01/2014 Elsevier Interactive Patient Education  2017 [employer].

## 2020-01-01 NOTE — Progress Notes (Signed)
Subjective:   Stacey Ritter is a 86 y.o. female who presents for Medicare Annual (Subsequent) preventive examination.  Review of Systems     Cardiac Risk Factors include: advanced age (>6855men, 59>65 women);diabetes mellitus;sedentary lifestyle     Objective:    There were no vitals filed for this visit. There is no height or weight on file to calculate BMI.  Advanced Directives 01/01/2020 09/07/2019 06/25/2019 04/30/2019 08/03/2016 08/02/2016  Does Patient Have a Medical Advance Directive? Yes Yes Yes Yes Yes Yes  Type of Advance Directive Living will;Out of facility DNR (pink MOST or yellow form) Healthcare Power of [place];Out of facility DNR (pink MOST or yellow form) Out of facility DNR (pink MOST or yellow form) Healthcare Power of [place];Living will Healthcare Power of [place];Living will Healthcare Power of [place];Living will  Does patient want to make changes to medical advance directive? No - Patient declined No - Patient declined No - Patient declined No - Patient declined No - Patient declined -  Copy of Healthcare Power of Attorney in Chart? - Yes - validated most recent copy scanned in chart (See row information) - Yes - validated most recent copy scanned in chart (See row information) No - copy requested -  Pre-existing out of facility DNR order (yellow form or pink MOST form) Yellow form placed in chart (order not valid for inpatient use);Pink MOST form placed in chart (order not valid for inpatient use) - - - - -    Current Medications (verified) Outpatient Encounter Medications as of 01/01/2020  Medication Sig  . Ascorbic Acid (VITAMIN C) 125 MG CHEW Chew by mouth.  Marland Kitchen. atorvastatin (LIPITOR) 20 MG tablet TAKE 1 TABLET(20 MG) BY MOUTH DAILY  . Carboxymethylcellulose Sodium (EYE DROPS OP) Apply to eye.  . carvedilol (COREG) 3.125 MG tablet TAKE 1 TABLET(3.125 MG) BY MOUTH TWICE DAILY WITH A MEAL  . conjugated estrogens (PREMARIN) vaginal cream One application three  time per week  . CRANBERRY SOFT PO Take 1 tablet by mouth daily. Gummy  . docusate sodium (COLACE) 100 MG capsule Take 100 mg by mouth every other day.  . FEROSUL 325 (65 Fe) MG tablet TAKE 1 TABLET(325 MG) BY MOUTH IN THE MORNING AND AT BEDTIME  . glimepiride (AMARYL) 4 MG tablet Take 1 tablet (4 mg total) by mouth in the morning and at bedtime.  Marland Kitchen. glucose blood test strip Check blood sugar twice daily as directed E11.9  . Lancets (ONETOUCH ULTRASOFT) lancets Check blood sugar twice daily DX E11.9  . loratadine (CLARITIN) 10 MG tablet Take 1 tablet (10 mg total) by mouth daily.  Marland Kitchen. losartan (COZAAR) 25 MG tablet Take 1 tablet (25 mg total) by mouth daily.  . metFORMIN (GLUCOPHAGE) 500 MG tablet TAKE 1 TABLET(500 MG) BY MOUTH TWICE DAILY WITH A MEAL  . mirabegron ER (MYRBETRIQ) 25 MG TB24 tablet Take 1 tablet (25 mg total) by mouth daily.  . pantoprazole (PROTONIX) 40 MG tablet Take 1 tablet (40 mg total) by mouth 2 (two) times daily before a meal.  . psyllium (METAMUCIL) 58.6 % packet Take 1 packet by mouth daily.  Marland Kitchen. SALINE NASAL MIST NA Place 1 spray into the nose as needed.   . sitaGLIPtin (JANUVIA) 100 MG tablet Take 1 tablet (100 mg total) by mouth daily.   No facility-administered encounter medications on file as of 01/01/2020.    Allergies (verified) Ampicillin, Penicillins, and Sulfur   History: Past Medical History:  Diagnosis Date  . Anemia   . Blindness   .  Diabetes mellitus without complication (HCC)   . Disorder of phosphorus metabolism   . GERD (gastroesophageal reflux disease)   . High blood pressure   . Hypercalcemia   . Macular degeneration   . Neuropathy   . Non-rheumatic aortic stenosis   . Osteoporosis    Past Surgical History:  Procedure Laterality Date  . APPENDECTOMY  1953  . CATARACT EXTRACTION Right 11/17/1982   Dr. Loney Hering - Model 108B / 15D  . CATARACT EXTRACTION Left 12/11/1991   Dr. [place] - Model [id number] / 14.5D  . COLONOSCOPY    . TONSILLECTOMY   1939   Family History  Problem Relation Age of Onset  . Psoriasis Father        Of the liver   . Osteoporosis Sister   . Lung cancer Brother   . Osteoporosis Sister   . Colon cancer Neg Hx   . Stomach cancer Neg Hx   . Esophageal cancer Neg Hx   . Pancreatic cancer Neg Hx   . Hyperparathyroidism Neg Hx    Social History   Socioeconomic History  . Marital status: Widowed    Spouse name: Not on file  . Number of children: Not on file  . Years of education: Not on file  . Highest education level: Not on file  Occupational History  . Not on file  Tobacco Use  . Smoking status: Never Smoker  . Smokeless tobacco: Never Used  Vaping Use  . Vaping Use: Never used  Substance and Sexual Activity  . Alcohol use: Not Currently  . Drug use: No  . Sexual activity: Not Currently    Partners: Male  Other Topics Concern  . 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 Yes   Do you have difficulty preparing food or eating? Yes   Do you have difficulty managing your medications? Yes   Do you have difficulty managing your finances? Yes   Do you have difficulty affording your medications? Yes   Social Determinants of Health   Financial Resource Strain: Not on file  Food Insecurity: Not  on file  Transportation Needs: Not on file  Physical Activity: Not on file  Stress: Not on file  Social Connections: Not on file    Tobacco Counseling Counseling given: Not Answered   Clinical Intake:  Pre-visit preparation completed: Yes  Pain : No/denies pain     BMI -  recorded: 21 Nutritional Status: BMI of 19-24  Normal Nutritional Risks: Unintentional weight loss Diabetes: Yes  How often do you need to have someone help you when you read instructions, pamphlets, or other written materials from your doctor or pharmacy?: 5 - Always  Diabetic?yes         Activities of Daily Living In your present state of health, do you have any difficulty performing the following activities: 01/01/2020  Hearing? Y  Vision? Y  Difficulty concentrating or making decisions? N  Walking or climbing stairs? N  Dressing or bathing? N  Doing errands, shopping? Y  Preparing Food and eating ? N  Using the Toilet? N  In the past six months, have you accidently leaked urine? N  Do you have problems with loss of bowel control? N  Managing your Medications? Y  Comment daughter helps  Managing your Finances? Y  Comment daughter helps  Housekeeping or managing your Housekeeping? Y  Comment daughter helps  Some recent data might be hidden    Patient Care Team: Sharon Seller, NP as PCP - General (Geriatric Medicine) Rollene Rotunda, MD as PCP - Cardiology (Cardiology)  Indicate any recent Medical Services you may have received from other than [facility] providers in the past year (date may be approximate).     Assessment:   This is a routine wellness examination for [facility].  Hearing/Vision screen  Hearing Screening             Right ear:           Left ear:           Comments: Patient states she feels like ears are clogged.  Vision Screening Comments: Patient has vision problems. Patient has macular degeneration.  Dietary  issues and exercise activities discussed: Current Exercise Habits: The patient does not participate in regular exercise at present  Goals    . Patient Stated     To stay healthy       Depression Screen PHQ 2/9 Scores 01/01/2020 04/30/2019  PHQ - 2 Score 0 0    Fall Risk Fall Risk  01/01/2020 09/07/2019 06/25/2019 06/18/2019 04/30/2019  Falls in the past year? 0 0 0 0 1  Number falls in past yr: 0 0 0 0 1  Comment - - - - slide out of bed  Injury with Fall? 0 0 0 0 0  Risk for fall due to : - - - - History of fall(s)    FALL RISK PREVENTION PERTAINING TO THE HOME:  Any stairs in or around the home? Yes  If so, are there any without handrails? Yes  Home free of loose throw rugs in walkways, pet beds, electrical cords, etc? Yes  Adequate lighting in your home to reduce risk of falls? Yes   ASSISTIVE DEVICES UTILIZED TO PREVENT FALLS:  Life alert? Yes  Use of a cane, walker or w/c? Yes  Grab bars in the bathroom? Yes  Shower chair or bench in shower? Yes  Elevated toilet seat or a handicapped toilet? Yes   TIMED UP AND GO:  Was the test performed? No .    Cognitive Function:     6CIT Screen 01/01/2020  What Year? 0 points  What month? 0 points  What time? 0 points  Count back from 20 0 points  Months in reverse 2 points  Repeat phrase 0 points  Total Score 2    Immunizations Immunization History  Administered Date(s) Administered  . Fluad Quad(high Dose  65+) 10/29/2019  . Influenza-Unspecified 10/12/2018  . PFIZER SARS-COV-2 Vaccination 04/17/2019, 05/15/2019  . Pneumococcal Conjugate-13 11/13/2014  . Pneumococcal Polysaccharide-23 11/29/2017    TDAP status: Due, Education has been provided regarding the importance of this vaccine. Advised may receive this vaccine at local pharmacy or Health Dept. Aware to provide a copy of the vaccination record if obtained from local pharmacy or Health Dept. Verbalized acceptance and understanding.  Flu Vaccine status: Up to  date  Pneumococcal vaccine status: Up to date  Covid-19 vaccine status: Completed vaccines  Qualifies for Shingles Vaccine? Yes   Zostavax completed No   Shingrix Completed?: No.    Education has been provided regarding the importance of this vaccine. Patient has been advised to call insurance company to determine out of pocket expense if they have not yet received this vaccine. Advised may also receive vaccine at local pharmacy or Health Dept. Verbalized acceptance and understanding.  Screening Tests Health Maintenance  Topic Date Due  . FOOT EXAM  Never done  . TETANUS/TDAP  Never done  . DEXA SCAN  Never done  . COVID-19 Vaccine (3 - Pfizer risk 4-dose series) 06/12/2019  . HEMOGLOBIN A1C  04/28/2020  . OPHTHALMOLOGY EXAM  05/13/2020  . INFLUENZA VACCINE  Completed  . PNA vac Low Risk Adult  Completed    Health Maintenance  Health Maintenance Due  Topic Date Due  . FOOT EXAM  Never done  . TETANUS/TDAP  Never done  . DEXA SCAN  Never done  . COVID-19 Vaccine (3 - Pfizer risk 4-dose series) 06/12/2019    Colorectal cancer screening: No longer required.   Mammogram status: No longer required due to aged.  Bone Density status: Ordered June 2021. Pt provided with contact info and advised to call to schedule appt.  Lung Cancer Screening: (Low Dose CT Chest recommended if Age 6-80 years, 30 pack-year currently smoking OR have quit w/in 15years.) does not qualify.   Lung Cancer Screening Referral: na  Additional Screening:  Hepatitis C Screening: does not qualify; Completed na  Vision Screening: Recommended annual ophthalmology exams for early detection of glaucoma and other disorders of the eye. Is the patient up to date with their annual eye exam?  Yes  Who is the provider or what is the name of the office in which the patient attends annual eye exams? Dr Dione Booze If pt is not established with a provider, would they like to be referred to a provider to establish care?  Yes .   Dental Screening: Recommended annual dental exams for proper oral hygiene  Community Resource Referral / Chronic Care Management: CRR required this visit?  No   CCM required this visit?  No      Plan:     I have personally reviewed and noted the following in the patient's chart:   . Medical and social history . Use of alcohol, tobacco or illicit drugs  . Current medications and supplements . Functional ability and status . Nutritional status . Physical activity . Advanced directives . List of other physicians . Hospitalizations, surgeries, and ER visits in previous 12 months . Vitals . Screenings to include cognitive, depression, and falls . Referrals and appointments  In addition, I have reviewed and discussed with patient certain preventive protocols, quality metrics, and best practice recommendations. A written personalized care plan for preventive services as well as general preventive health recommendations were provided to patient.     Sharon Seller, NP   01/01/2020  Virtual Visit via Telephone Note  I connected with@ on 01/01/20 at 11:00 AM EST by telephone and verified that I am speaking with the correct person using two identifiers.  Location: Patient: home Provider: [facility].    I discussed the limitations, risks, security and privacy concerns of performing an evaluation and management service by telephone and the availability of in person appointments. I also discussed with the patient that there may be a patient responsible charge related to this service. The patient expressed understanding and agreed to proceed.   I discussed the assessment and treatment plan with the patient. The patient was provided an opportunity to ask questions and all were answered. The patient agreed with the plan and demonstrated an understanding of the instructions.   The patient was advised to call back or seek an in-person evaluation if the symptoms worsen or if  the condition fails to improve as anticipated.  I provided 18 minutes of non-face-to-face time during this encounter.  Janene Harvey. [employer]ag[employer] printed and mailed

## 2020-01-01 NOTE — Progress Notes (Signed)
This service is provided via telemedicine  No vital signs collected/recorded due to the encounter was a telemedicine visit.   Location of patient (ex: home, work): Home  Patient consents to a telephone visit:  Yes, see encounter dated 01/01/2020  Location of the provider (ex: office, home):  [facility]  Name of any referring provider: N/A  Names of all persons participating in the telemedicine service and their role in the encounter: Abbey Chatters, Nurse Practitioner, Elveria Royals, CMA, patient and patient's daughter [name].   Time spent on call:  10 minutes with medical assistant.

## 2020-01-11 ENCOUNTER — Other Ambulatory Visit: Payer: Self-pay | Admitting: Nurse Practitioner

## 2020-01-11 DIAGNOSIS — J302 Other seasonal allergic rhinitis: Secondary | ICD-10-CM

## 2020-01-11 DIAGNOSIS — E1142 Type 2 diabetes mellitus with diabetic polyneuropathy: Secondary | ICD-10-CM

## 2020-01-11 DIAGNOSIS — I10 Essential (primary) hypertension: Secondary | ICD-10-CM

## 2020-01-11 DIAGNOSIS — N3281 Overactive bladder: Secondary | ICD-10-CM

## 2020-01-23 ENCOUNTER — Other Ambulatory Visit: Payer: Self-pay

## 2020-01-23 MED ORDER — PANTOPRAZOLE SODIUM 40 MG PO TBEC
40.0000 mg | DELAYED_RELEASE_TABLET | Freq: Two times a day (BID) | ORAL | 3 refills | Status: DC
Start: 2020-01-23 — End: 2020-05-01

## 2020-01-29 ENCOUNTER — Other Ambulatory Visit: Payer: Self-pay | Admitting: Nurse Practitioner

## 2020-02-01 ENCOUNTER — Other Ambulatory Visit: Payer: Self-pay | Admitting: Nurse Practitioner

## 2020-02-27 ENCOUNTER — Other Ambulatory Visit: Payer: Self-pay | Admitting: Nurse Practitioner

## 2020-03-10 ENCOUNTER — Telehealth: Payer: Self-pay | Admitting: *Deleted

## 2020-03-10 NOTE — Telephone Encounter (Signed)
Noted, thank you

## 2020-03-10 NOTE — Telephone Encounter (Signed)
Patient daughter, Gerome Apley, called and stated that patient cannot manage her own medication, Cannot cook. Daughter leaves medications in a pill box to help but believes patient still over medicates.  Daughter also pre makes meals and leaves them in the refrigerator but patient still cannot find them.  Daughter still works but trys to make things easy for her mom while she is at work.  Daughter believes it is time for patient to transition into a facility.  Daughter is going to start looking for a facility and give [place] a call for an FL2 form.   FYI.

## 2020-03-11 ENCOUNTER — Ambulatory Visit: Payer: Medicare Other | Admitting: Nurse Practitioner

## 2020-03-28 ENCOUNTER — Other Ambulatory Visit: Payer: Self-pay

## 2020-03-28 ENCOUNTER — Encounter: Payer: Self-pay | Admitting: Nurse Practitioner

## 2020-03-28 ENCOUNTER — Ambulatory Visit (INDEPENDENT_AMBULATORY_CARE_PROVIDER_SITE_OTHER): Payer: Medicare Other | Admitting: Nurse Practitioner

## 2020-03-28 VITALS — BP 120/80 | HR 74 | Temp 97.7°F | Ht 61.0 in | Wt 112.0 lb

## 2020-03-28 DIAGNOSIS — M19041 Primary osteoarthritis, right hand: Secondary | ICD-10-CM

## 2020-03-28 DIAGNOSIS — D509 Iron deficiency anemia, unspecified: Secondary | ICD-10-CM | POA: Diagnosis not present

## 2020-03-28 DIAGNOSIS — R413 Other amnesia: Secondary | ICD-10-CM | POA: Diagnosis not present

## 2020-03-28 DIAGNOSIS — I350 Nonrheumatic aortic (valve) stenosis: Secondary | ICD-10-CM

## 2020-03-28 DIAGNOSIS — R5381 Other malaise: Secondary | ICD-10-CM

## 2020-03-28 DIAGNOSIS — M19042 Primary osteoarthritis, left hand: Secondary | ICD-10-CM

## 2020-03-28 DIAGNOSIS — E1140 Type 2 diabetes mellitus with diabetic neuropathy, unspecified: Secondary | ICD-10-CM

## 2020-03-28 DIAGNOSIS — I10 Essential (primary) hypertension: Secondary | ICD-10-CM | POA: Diagnosis not present

## 2020-03-28 DIAGNOSIS — R7989 Other specified abnormal findings of blood chemistry: Secondary | ICD-10-CM | POA: Diagnosis not present

## 2020-03-28 DIAGNOSIS — H353134 Nonexudative age-related macular degeneration, bilateral, advanced atrophic with subfoveal involvement: Secondary | ICD-10-CM

## 2020-03-28 DIAGNOSIS — E782 Mixed hyperlipidemia: Secondary | ICD-10-CM

## 2020-03-28 DIAGNOSIS — R634 Abnormal weight loss: Secondary | ICD-10-CM

## 2020-03-28 DIAGNOSIS — N3281 Overactive bladder: Secondary | ICD-10-CM

## 2020-03-28 NOTE — Progress Notes (Signed)
Careteam: Patient Care Team: Sharon SellerEubanks, Victorya Hillman K, NP as PCP - General (Geriatric Medicine) Rollene RotundaHochrein, James, MD as PCP - Cardiology (Cardiology)  PLACE [facility] SERVICE:  [facility]  Advanced Directive information    Allergies  Allergen Reactions  . Ampicillin Rash  . Elemental Sulfur Rash  . Penicillins Rash    Has patient had a PCN reaction causing immediate rash, facial/tongue/throat swelling, SOB or lightheadedness with hypotension: Yes Has patient had a PCN reaction causing severe rash involving mucus membranes or skin necrosis: No Has patient had a PCN reaction that required hospitalization: No Has patient had a PCN reaction occurring within the last 10 years: No If all of the above answers are "NO", then may proceed with Cephalosporin use.     Chief Complaint  Patient presents with  . Acute Visit    Patient has concerns about the safety of her mother. She states that patient is overmedicating. Daughter is usually able to portion it out, and give it to her but works so unable to do it.Meals are prepared and placed in fridge,but she doesn't see them because she is legally blind.Called life alert twice this week by accident. Wants to know about rehab services maybe something temporary or even permanent, but not assisted living. Thinks Skilled nursing will be better.Neuropathy is worse. Knot in stomach     HPI: Patient is a 87 y.o. female for follow up.  [place] and now back at home. She was there for 2 weeks.   She is having a hard time getting dress, opening drawers.  Very limited for ADLs  Lives with her daughter but daughter works.  Vision is worsening, can not see to get her own lunch and pills.  Ongoing weight loss- light dinner, eats a good breakfast, and eats lunch from meals on wheels.   DM- can not see her meter. Daughter will occasionally check blood sugars, overall blood sugars have been stable. No low blood sugars. On januvia and  metformin- likes pastries.   Overactive bladder- continues on myrbetriq    She needs more supervision. Keeps hitting her medical alert bracelet by accident.   Memory loss- worsening memory loss, can not see due to MD to get her medication. Daughter has to give her zip lock bags with medications- she will take too much if daughter does not supervise this.    Review of Systems:  Review of Systems  Constitutional: Negative for chills, fever and weight loss.  HENT: Negative for tinnitus.   Respiratory: Negative for cough, sputum production and shortness of breath.   Cardiovascular: Negative for chest pain, palpitations and leg swelling.  Gastrointestinal: Negative for abdominal pain, constipation, diarrhea and heartburn.  Genitourinary: Positive for frequency. Negative for dysuria and urgency.  Musculoskeletal: Positive for joint pain and myalgias. Negative for back pain and falls.  Skin: Negative.   Neurological: Negative for dizziness and headaches.  Psychiatric/Behavioral: Positive for memory loss. Negative for depression. The patient does not have insomnia.    Past Medical History:  Diagnosis Date  . Anemia   . Blindness   . Diabetes mellitus without complication (HCC)   . Disorder of phosphorus metabolism   . GERD (gastroesophageal reflux disease)   . High blood pressure   . Hypercalcemia   . Macular degeneration   . Neuropathy   . Non-rheumatic aortic stenosis   . Osteoporosis    Past Surgical History:  Procedure Laterality Date  . APPENDECTOMY  1953  . CATARACT EXTRACTION  Right 11/17/1982   Dr. Loney Hering - Model 108B / 15D  . CATARACT EXTRACTION Left 12/11/1991   Dr. [place] - Model [id number] / 14.5D  . COLONOSCOPY    . TONSILLECTOMY  1939   Social History:   reports that she has never smoked. She has never used smokeless tobacco. She reports previous alcohol use. She reports that she does not use drugs.  Family History  Problem Relation Age of Onset  . Psoriasis Father         Of the liver   . Osteoporosis Sister   . Lung cancer Brother   . Osteoporosis Sister   . Colon cancer Neg Hx   . Stomach cancer Neg Hx   . Esophageal cancer Neg Hx   . Pancreatic cancer Neg Hx   . Hyperparathyroidism Neg Hx     Medications: Patient's Medications  New Prescriptions   No medications on file  Previous Medications   ASCORBIC ACID (VITAMIN C) 125 MG CHEW    Chew by mouth.   ATORVASTATIN (LIPITOR) 20 MG TABLET    TAKE 1 TABLET(20 MG) BY MOUTH DAILY   CARBOXYMETHYLCELLULOSE SODIUM (EYE DROPS OP)    Apply to eye.   CARVEDILOL (COREG) 3.125 MG TABLET    TAKE 1 TABLET(3.125 MG) BY MOUTH TWICE DAILY WITH A MEAL   CONJUGATED ESTROGENS (PREMARIN) VAGINAL CREAM    One application three time per week   CRANBERRY SOFT PO    Take 1 tablet by mouth daily. Gummy   DOCUSATE SODIUM (COLACE) 100 MG CAPSULE    Take 100 mg by mouth every other day.   FEROSUL 325 (65 FE) MG TABLET    TAKE 1 TABLET(325 MG) BY MOUTH IN THE MORNING AND AT BEDTIME   GLIMEPIRIDE (AMARYL) 4 MG TABLET    TAKE 1 TABLET(4 MG) BY MOUTH IN THE MORNING AND AT BEDTIME   GLUCOSE BLOOD TEST STRIP    Check blood sugar twice daily as directed E11.9   LANCETS (ONETOUCH ULTRASOFT) LANCETS    Check blood sugar twice daily DX E11.9   LORATADINE (CLARITIN) 10 MG TABLET    TAKE 1 TABLET(10 MG) BY MOUTH DAILY   LOSARTAN (COZAAR) 25 MG TABLET    TAKE 1 TABLET(25 MG) BY MOUTH DAILY   METFORMIN (GLUCOPHAGE) 500 MG TABLET    TAKE 1 TABLET(500 MG) BY MOUTH TWICE DAILY WITH A MEAL   MYRBETRIQ 25 MG TB24 TABLET    TAKE 1 TABLET(25 MG) BY MOUTH DAILY   PANTOPRAZOLE (PROTONIX) 40 MG TABLET    Take 1 tablet (40 mg total) by mouth 2 (two) times daily before a meal.   PSYLLIUM (METAMUCIL) 58.6 % PACKET    Take 1 packet by mouth daily.   SALINE NASAL MIST NA    Place 1 spray into the nose as needed.    SITAGLIPTIN (JANUVIA) 100 MG TABLET    Take 1 tablet (100 mg total) by mouth daily.  Modified Medications   No medications on file   Discontinued Medications   No medications on file    Physical Exam:  Vitals:   03/28/20 1341  BP: 120/80  Pulse: 74  Temp: 97.7 F (36.5 C)  TempSrc: Temporal  SpO2: 98%  Weight: 112 lb (50.8 kg)  Height: 5\' 1"  (1.549 m)   Body mass index is 21.16 kg/m. Wt Readings from Last 3 Encounters:  03/28/20 112 lb (50.8 kg)  10/29/19 113 lb (51.3 kg)  09/07/19 115 lb 6.4 oz (52.3 kg)  Physical Exam Constitutional:      General: She is not in acute distress.    Appearance: She is well-developed. She is not diaphoretic.  HENT:     Head: Normocephalic and atraumatic.     Mouth/Throat:     Pharynx: No oropharyngeal exudate.  Eyes:     Conjunctiva/sclera: Conjunctivae normal.     Pupils: Pupils are equal, round, and reactive to light.  Cardiovascular:     Rate and Rhythm: Normal rate and regular rhythm.     Heart sounds: Normal heart sounds.  Pulmonary:     Effort: Pulmonary effort is normal.     Breath sounds: Normal breath sounds.  Abdominal:     General: Bowel sounds are normal.     Palpations: Abdomen is soft.  Musculoskeletal:        General: No tenderness.     Cervical back: Normal range of motion and neck supple.  Skin:    General: Skin is warm and dry.  Neurological:     Mental Status: She is alert.     Labs reviewed: Basic Metabolic Panel: Recent Labs    05/01/19 0810 06/01/19 0907 07/13/19 1542 10/29/19 1200  NA 138  --  139 138  K 3.6  --  3.8 3.9  CL 101  --  102 104  CO2 27  --  27 22  GLUCOSE 155*  --  74 135  BUN 20  --  31* 43*  CREATININE 0.81  --  0.85 1.02*  CALCIUM 9.6 10.4 10.1 10.1  MG  --  1.6  --   --   PHOS  --  2.4  --   --   TSH 1.75  --   --   --    Liver Function Tests: Recent Labs    05/01/19 0810 10/29/19 1200  AST 14 14  ALT 12 11  BILITOT 0.6 0.4  PROT 6.6 6.9   No results for input(s): LIPASE, AMYLASE in the last 8760 hours. No results for input(s): AMMONIA in the last 8760 hours. CBC: Recent Labs     05/01/19 0810 10/29/19 1200  WBC 6.6 5.9  NEUTROABS 4,448 4,130  HGB 10.6* 10.3*  HCT 33.3* 32.9*  MCV 87.2 93.5  PLT 160 170   Lipid Panel: Recent Labs    05/01/19 0810  CHOL 147  HDL 56  LDLCALC 75  TRIG 84  CHOLHDL 2.6   TSH: Recent Labs    05/01/19 0810  TSH 1.75   A1C: Lab Results  Component Value Date   HGBA1C 5.7 (H) 10/29/2019     Assessment/Plan 1. Advanced nonexudative age-related macular degeneration of both eyes with subfoveal involvement -progressive decline of vision making it harder for her to see. Needing more assistance around the house.  - AMB Referral to [facility] Care Management - Amb Referral to Palliative Care - Ambulatory referral to Home Health  2. Type 2 diabetes mellitus with diabetic neuropathy, without long-term current use of insulin (HCC) -controlled on current regimen, no hypoglycemia noted.  - AMB Referral to [facility] Care Management - Hemoglobin A1c - Amb Referral to Palliative Care  3. Iron deficiency anemia, unspecified iron deficiency anemia type -continues on iron supplement twice daily - CBC with Differential/Platelet  4. Memory loss -progressively worse memory loss, needing increase in supervision and help with ADLs.  - AMB Referral to [facility] Care Management - Amb Referral to Palliative Care - Ambulatory referral to Home Health  5. Primary osteoarthritis of both hands -ongoing.  Having a harder time with ADLs, request therapy to help.  - AMB Referral to [facility] Care Management - Amb Referral to Palliative Care - Ambulatory referral to Home Health  6. Essential hypertension -controlled on current regimen, continues losartan and coreg  - COMPLETE METABOLIC PANEL WITH GFR - CBC with Differential/Platelet  7. Weight loss -progressive weight loss due to progression of memory loss/dementia. Expect to worsen as disease progresses.  - Ambulatory referral to Home Health  8. Overactive bladder Stable, Continues on myrbetriq 25 mg  daily  9. Mixed hyperlipidemia -continues on lipitor.  - Lipid Panel  10. Aortic valve stenosis, moderate: Per 2-D echo 08/04/2016 Stable at this time, no progressive symptoms at this time.  - Amb Referral to Palliative Care  11. Debility -overall decline in physical and cognitive status. Daughter is not at home during the day to supervise her and she is now needing increase in supervision. Looking into SNF placement but needing more guidance. Questions answered during today's visit and consults made.  - Amb Referral to Palliative Care  Next appt: 3 months, sooner if needed  Amanda Steuart K. Biagio Borg  [facility] [phone]

## 2020-03-30 ENCOUNTER — Other Ambulatory Visit: Payer: Self-pay | Admitting: Nurse Practitioner

## 2020-03-30 DIAGNOSIS — E1142 Type 2 diabetes mellitus with diabetic polyneuropathy: Secondary | ICD-10-CM

## 2020-03-31 ENCOUNTER — Telehealth: Payer: Self-pay

## 2020-03-31 NOTE — Telephone Encounter (Signed)
Spoke with patients daughter and scheduled an in-person Palliative Consult for 04/18/20 @ 2:30PM  COVID screening was negative. No pets in home. Patient lives with her daughter.   Consent obtained; updated Outlook/Netsmart/Team List and Epic.  Family is aware they may be receiving a call from NP the day before or day of to confirm appointment.

## 2020-04-01 LAB — LIPID PANEL
Cholesterol: 116 mg/dL (ref <200)
HDL: 58 mg/dL (ref >=50)
LDL Cholesterol (Calc): 44 mg/dL (calc)
Non-HDL Cholesterol (Calc): 58 mg/dL (calc) (ref <130)
Total CHOL/HDL Ratio: 2 (calc) (ref <5.0)
Triglycerides: 64 mg/dL (ref <150)

## 2020-04-01 LAB — CBC WITH DIFFERENTIAL/PLATELET
Absolute Monocytes: 599 cells/uL (ref 200–950)
Basophils Absolute: 11 cells/uL (ref 0–200)
Basophils Relative: 0.2 %
Eosinophils Absolute: 90 cells/uL (ref 15–500)
Eosinophils Relative: 1.6 %
HCT: 28.6 % — ABNORMAL LOW (ref 35.0–45.0)
Hemoglobin: 9.1 g/dL — ABNORMAL LOW (ref 11.7–15.5)
Lymphs Abs: 1327 cells/uL (ref 850–3900)
MCH: 28 pg (ref 27.0–33.0)
MCHC: 31.8 g/dL — ABNORMAL LOW (ref 32.0–36.0)
MCV: 88 fL (ref 80.0–100.0)
MPV: 12.2 fL (ref 7.5–12.5)
Monocytes Relative: 10.7 %
Neutro Abs: 3573 cells/uL (ref 1500–7800)
Neutrophils Relative %: 63.8 %
Platelets: 194 10*3/uL (ref 140–400)
RBC: 3.25 10*6/uL — ABNORMAL LOW (ref 3.80–5.10)
RDW: 12.8 % (ref 11.0–15.0)
Total Lymphocyte: 23.7 %
WBC: 5.6 10*3/uL (ref 3.8–10.8)

## 2020-04-01 LAB — COMPLETE METABOLIC PANEL WITH GFR
AG Ratio: 1.4 (calc) (ref 1.0–2.5)
ALT: 11 U/L (ref 6–29)
AST: 9 U/L — ABNORMAL LOW (ref 10–35)
Albumin: 3.9 g/dL (ref 3.6–5.1)
Alkaline phosphatase (APISO): 60 U/L (ref 37–153)
BUN/Creatinine Ratio: 36 (calc) — ABNORMAL HIGH (ref 6–22)
BUN: 40 mg/dL — ABNORMAL HIGH (ref 7–25)
CO2: 25 mmol/L (ref 20–32)
Calcium: 10.3 mg/dL (ref 8.6–10.4)
Chloride: 105 mmol/L (ref 98–110)
Creat: 1.1 mg/dL — ABNORMAL HIGH (ref 0.60–0.88)
GFR, Est African American: 51 mL/min/{1.73_m2} — ABNORMAL LOW (ref >=60)
GFR, Est Non African American: 44 mL/min/{1.73_m2} — ABNORMAL LOW (ref >=60)
Globulin: 2.7 g/dL (calc) (ref 1.9–3.7)
Glucose, Bld: 72 mg/dL (ref 65–139)
Potassium: 4.4 mmol/L (ref 3.5–5.3)
Sodium: 139 mmol/L (ref 135–146)
Total Bilirubin: 0.3 mg/dL (ref 0.2–1.2)
Total Protein: 6.6 g/dL (ref 6.1–8.1)

## 2020-04-01 LAB — TEST AUTHORIZATION

## 2020-04-01 LAB — IRON,TIBC AND FERRITIN PANEL
%SAT: 5 % (calc) — ABNORMAL LOW (ref 16–45)
Ferritin: 7 ng/mL — ABNORMAL LOW (ref 16–288)
Iron: 20 ug/dL — ABNORMAL LOW (ref 45–160)
TIBC: 382 mcg/dL (calc) (ref 250–450)

## 2020-04-01 LAB — HEMOGLOBIN A1C
Hgb A1c MFr Bld: 6.3 % of total Hgb — ABNORMAL HIGH (ref <5.7)
Mean Plasma Glucose: 134 mg/dL
eAG (mmol/L): 7.4 mmol/L

## 2020-04-02 ENCOUNTER — Other Ambulatory Visit: Payer: Self-pay

## 2020-04-02 DIAGNOSIS — E611 Iron deficiency: Secondary | ICD-10-CM

## 2020-04-02 DIAGNOSIS — D649 Anemia, unspecified: Secondary | ICD-10-CM

## 2020-04-02 DIAGNOSIS — D509 Iron deficiency anemia, unspecified: Secondary | ICD-10-CM

## 2020-04-03 ENCOUNTER — Other Ambulatory Visit: Payer: Self-pay | Admitting: *Deleted

## 2020-04-03 NOTE — Patient Outreach (Signed)
[facility]) Care Management  04/03/2020  Stacey Ritter 1926-03-27 [id number]  MD Referral Received 3/18 Initial Outreach 3/22   Telephone Assessment-Successful  RN spoke with the pt's daughter Stacey Ritter who is the primary caregiver. RN introduced [facility] services and program available to possibly assist pt further with her ongoing management of care. Daughter began to discussed all issues and needs of the pt however since this referral several other agencies are involved with the care for this pt.  Daughter not sure of the names of the agencies and the exact services but has requested to call this RN case manager back on tomorrow with more details not to duplicate the involved services.   Daughter further explains pt has applied for MCD and may qualify however there is a pending call to the case worker who has not returned the call to verified information and finalize this process. Caregiver attempted to accommodate pt's care in the home with respite care used in the past however not available at this time and caregiver is aware of self pay for additional weeks. Reports pt was self managing her care by taking her prescribed medication layed out by the caregiver when she was at work. In addition to having a medical alert however pt tends to push the medical alert button as she thinks for test when it is an actual call and caregiver reports pt is not taking her medications based upon the pill box when she returns home. Other issues related to frequent toileting, Anemia levels are low and high risk for falls in the home. At this time the caregiver is requested placement. RN discussed memory unit placement facilities and offered a consult with [facility] social worker for this placement however the caregiver once again will check with the other involved agencies concerning possible placement prior to this RN case manager making a referral for the same services. Caregiver daughter Stacey Ritter indicated  she would call this RB care manager back on tomorrow to proceed with a referral if needed. Currently pt has no medical issues at this time to manage only level of care issues presented today.  No other needs presented as RN provider contact number for the call back. Again Daughter wishes to reach out to this case manager when she has more information and alert [facility] if she continue to need further assistance with placement of this pt.   Will await a call back from the daughter Stacey Ritter.  [name], RN Care Management Coordinator [facility] Main Office [phone]

## 2020-04-10 ENCOUNTER — Telehealth: Payer: Self-pay

## 2020-04-10 DIAGNOSIS — H547 Unspecified visual loss: Secondary | ICD-10-CM | POA: Diagnosis not present

## 2020-04-10 DIAGNOSIS — K219 Gastro-esophageal reflux disease without esophagitis: Secondary | ICD-10-CM | POA: Diagnosis not present

## 2020-04-10 DIAGNOSIS — M19041 Primary osteoarthritis, right hand: Secondary | ICD-10-CM | POA: Diagnosis not present

## 2020-04-10 DIAGNOSIS — E1140 Type 2 diabetes mellitus with diabetic neuropathy, unspecified: Secondary | ICD-10-CM | POA: Diagnosis not present

## 2020-04-10 DIAGNOSIS — R413 Other amnesia: Secondary | ICD-10-CM | POA: Diagnosis not present

## 2020-04-10 DIAGNOSIS — H353131 Nonexudative age-related macular degeneration, bilateral, early dry stage: Secondary | ICD-10-CM | POA: Diagnosis not present

## 2020-04-10 DIAGNOSIS — I350 Nonrheumatic aortic (valve) stenosis: Secondary | ICD-10-CM | POA: Diagnosis not present

## 2020-04-10 DIAGNOSIS — R634 Abnormal weight loss: Secondary | ICD-10-CM | POA: Diagnosis not present

## 2020-04-10 DIAGNOSIS — M19042 Primary osteoarthritis, left hand: Secondary | ICD-10-CM | POA: Diagnosis not present

## 2020-04-10 DIAGNOSIS — I10 Essential (primary) hypertension: Secondary | ICD-10-CM | POA: Diagnosis not present

## 2020-04-10 DIAGNOSIS — D509 Iron deficiency anemia, unspecified: Secondary | ICD-10-CM | POA: Diagnosis not present

## 2020-04-10 DIAGNOSIS — N3281 Overactive bladder: Secondary | ICD-10-CM | POA: Diagnosis not present

## 2020-04-10 NOTE — Telephone Encounter (Signed)
Okay to get UA and culture, does she want an appt in office sooner?

## 2020-04-10 NOTE — Telephone Encounter (Signed)
Left message on voicemail for patient to return call when available . Reason for call, offer appointment with [facility] tomorrow

## 2020-04-10 NOTE — Telephone Encounter (Signed)
Stacey Ritter with [facility] called and left message on voicemail requesting orders for Urinalysis and culture to be collected next Monday during visit. Patient complain of urinary frequency and burning.  Please advise

## 2020-04-11 NOTE — Telephone Encounter (Signed)
Patients daughter returned call and stated they will wait until Monday. Order faxed to [id number]  I called Neysa Bonito and left message letting her know that we are faxing order

## 2020-04-14 ENCOUNTER — Encounter: Payer: Self-pay | Admitting: Nurse Practitioner

## 2020-04-15 NOTE — Telephone Encounter (Signed)
Received fax from [facility] for Urinalysis test:  Stated that test was not performed. No suitable specimen received.   Placed fax in Jessica's folder to review.

## 2020-04-17 ENCOUNTER — Telehealth: Payer: Self-pay | Admitting: Nurse Practitioner

## 2020-04-17 NOTE — Telephone Encounter (Signed)
Radovan the occupational therapist from [facility] called to let you know that he did not get to seeing Caffie this week, but he will be seeing her this coming up week. If we need to call Radovan back we can reach him at [phone].

## 2020-04-18 ENCOUNTER — Other Ambulatory Visit: Payer: Self-pay

## 2020-04-18 ENCOUNTER — Other Ambulatory Visit: Payer: Medicare Other | Admitting: Nurse Practitioner

## 2020-04-23 DIAGNOSIS — N390 Urinary tract infection, site not specified: Secondary | ICD-10-CM | POA: Diagnosis not present

## 2020-04-24 ENCOUNTER — Telehealth: Payer: Self-pay

## 2020-04-24 NOTE — Telephone Encounter (Signed)
Message left on clinical intake voicemail: FYI  Patient will have her occupational therapy visit next week due to company staffing shortage this week.

## 2020-04-28 ENCOUNTER — Ambulatory Visit (INDEPENDENT_AMBULATORY_CARE_PROVIDER_SITE_OTHER): Payer: Medicare Other | Admitting: Nurse Practitioner

## 2020-04-28 ENCOUNTER — Other Ambulatory Visit: Payer: Self-pay

## 2020-04-28 ENCOUNTER — Encounter: Payer: Self-pay | Admitting: Nurse Practitioner

## 2020-04-28 VITALS — BP 118/76 | HR 64 | Temp 97.8°F | Ht 61.0 in | Wt 113.0 lb

## 2020-04-28 DIAGNOSIS — N898 Other specified noninflammatory disorders of vagina: Secondary | ICD-10-CM

## 2020-04-28 DIAGNOSIS — R319 Hematuria, unspecified: Secondary | ICD-10-CM

## 2020-04-28 DIAGNOSIS — E611 Iron deficiency: Secondary | ICD-10-CM

## 2020-04-28 DIAGNOSIS — N390 Urinary tract infection, site not specified: Secondary | ICD-10-CM

## 2020-04-28 DIAGNOSIS — N3281 Overactive bladder: Secondary | ICD-10-CM

## 2020-04-28 DIAGNOSIS — K5904 Chronic idiopathic constipation: Secondary | ICD-10-CM | POA: Diagnosis not present

## 2020-04-28 DIAGNOSIS — E1140 Type 2 diabetes mellitus with diabetic neuropathy, unspecified: Secondary | ICD-10-CM

## 2020-04-28 DIAGNOSIS — R350 Frequency of micturition: Secondary | ICD-10-CM

## 2020-04-28 LAB — CBC WITH DIFFERENTIAL/PLATELET
Absolute Monocytes: 585 cells/uL (ref 200–950)
Basophils Absolute: 23 cells/uL (ref 0–200)
Basophils Relative: 0.3 %
Eosinophils Absolute: 173 cells/uL (ref 15–500)
Eosinophils Relative: 2.3 %
HCT: 27.4 % — ABNORMAL LOW (ref 35.0–45.0)
Hemoglobin: 8.3 g/dL — ABNORMAL LOW (ref 11.7–15.5)
Lymphs Abs: 1178 cells/uL (ref 850–3900)
MCH: 26.5 pg — ABNORMAL LOW (ref 27.0–33.0)
MCHC: 30.3 g/dL — ABNORMAL LOW (ref 32.0–36.0)
MCV: 87.5 fL (ref 80.0–100.0)
MPV: 12.2 fL (ref 7.5–12.5)
Monocytes Relative: 7.8 %
Neutro Abs: 5543 cells/uL (ref 1500–7800)
Neutrophils Relative %: 73.9 %
Platelets: 227 10*3/uL (ref 140–400)
RBC: 3.13 10*6/uL — ABNORMAL LOW (ref 3.80–5.10)
RDW: 13.6 % (ref 11.0–15.0)
Total Lymphocyte: 15.7 %
WBC: 7.5 10*3/uL (ref 3.8–10.8)

## 2020-04-28 LAB — POCT URINALYSIS DIPSTICK
Bilirubin, UA: NEGATIVE
Glucose, UA: NEGATIVE
Ketones, UA: NEGATIVE
Nitrite, UA: NEGATIVE
Protein, UA: POSITIVE — AB
Spec Grav, UA: 1.015 (ref 1.010–1.025)
Urobilinogen, UA: 0.2 E.U./dL
pH, UA: 5 (ref 5.0–8.0)

## 2020-04-28 MED ORDER — PREMARIN 0.625 MG/GM VA CREA: TOPICAL_CREAM | VAGINAL | 12 refills | Status: DC

## 2020-04-28 MED ORDER — SACCHAROMYCES BOULARDII 250 MG PO CAPS: 250.0000 mg | ORAL_CAPSULE | Freq: Two times a day (BID) | ORAL | 0 refills | Status: AC

## 2020-04-28 MED ORDER — MIRABEGRON ER 50 MG PO TB24: 50.0000 mg | ORAL_TABLET | Freq: Every day | ORAL | 1 refills | Status: AC

## 2020-04-28 MED ORDER — CIPROFLOXACIN HCL 500 MG PO TABS
500.0000 mg | ORAL_TABLET | Freq: Two times a day (BID) | ORAL | 0 refills | Status: DC
Start: 2020-04-28 — End: 2020-08-14

## 2020-04-28 NOTE — Progress Notes (Signed)
Careteam: Patient Care Team: Sharon Seller, NP as PCP - General (Geriatric Medicine) Rollene Rotunda, MD as PCP - Cardiology (Cardiology) Alejandro Mulling, RN as [facility] Care Management  PLACE OF SERVICE:  [facility] CL[facility]  Advanced Directive information Does Patient Have a Medical Advance Directive?: Yes, Type of Advance Directive: Healthcare Power of [place];Out of facility DNR (pink MOST or yellow form);Living will, Pre-existing out of facility DNR order (yellow form or pink MOST form): Yellow form placed in chart (order not valid for inpatient use);Pink MOST form placed in chart (order not valid for inpatient use), Does patient want to make changes to medical advance directive?: No - Patient declined  Allergies  Allergen Reactions  . Ampicillin Rash  . Elemental Sulfur Rash  . Penicillins Rash    Has patient had a PCN reaction causing immediate rash, facial/tongue/throat swelling, SOB or lightheadedness with hypotension: Yes Has patient had a PCN reaction causing severe rash involving mucus membranes or skin necrosis: No Has patient had a PCN reaction that required hospitalization: No Has patient had a PCN reaction occurring within the last 10 years: No If all of the above answers are "NO", then may proceed with Cephalosporin use.     Chief Complaint  Patient presents with  . Medical Management of Chronic Issues    6 month follow-up and arthritis pain in fingers. Discuss need for foot exam, td/tadp, DEXA, and covid booster. Patient c/o going to the bathroom every hour (ongoing concern).      HPI: Patient is a 87 y.o. 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 Daughter plans to implement this.   OAB/vaginal atrophy- has urinary frequency  Iron def anemia- has been taking her iron- no signs of blood loss.  No vaginal bleeding, blood in stool or urine, bloody nose.   DM- no  hypoglycemia.   Review of Systems:  Review of Systems  Constitutional: Negative for chills, fever and weight loss.  HENT: Negative for tinnitus.   Respiratory: Negative for cough, sputum production and shortness of breath.   Cardiovascular: Negative for chest pain, palpitations and leg swelling.  Gastrointestinal: Positive for heartburn. Negative for abdominal pain, constipation and diarrhea.  Genitourinary: Positive for frequency. Negative for dysuria and urgency.  Musculoskeletal: Negative for back pain, falls, joint pain and myalgias.  Skin: Negative.   Neurological: Negative for dizziness and headaches.  Psychiatric/Behavioral: Positive for memory loss. Negative for depression. The patient does not have insomnia.     Past Medical History:  Diagnosis Date  . Anemia   . Blindness   . Diabetes mellitus without complication (HCC)   . Disorder of phosphorus metabolism   . GERD (gastroesophageal reflux disease)   . High blood pressure   . Hypercalcemia   . Macular degeneration   . Neuropathy   . Non-rheumatic aortic stenosis   . Osteoporosis    Past Surgical History:  Procedure Laterality Date  . APPENDECTOMY  1953  . CATARACT EXTRACTION Right 11/17/1982   Dr. Loney Hering - Model 108B / 15D  . CATARACT EXTRACTION Left 12/11/1991   Dr. Swaziland - Model [id number] / 14.5D  . COLONOSCOPY    . TONSILLECTOMY  1939   Social History:   reports that she has never smoked. She has never used smokeless tobacco. She reports previous alcohol use. She reports that she does not use drugs.  Family History  Problem  Relation Age of Onset  . Psoriasis Father        Of the liver   . Osteoporosis Sister   . Lung cancer Brother   . Osteoporosis Sister   . Colon cancer Neg Hx   . Stomach cancer Neg Hx   . Esophageal cancer Neg Hx   . Pancreatic cancer Neg Hx   . Hyperparathyroidism Neg Hx     Medications: Patient's Medications  New Prescriptions   No medications on file  Previous Medications    ASCORBIC ACID (VITAMIN C) 125 MG CHEW    Chew by mouth.   ATORVASTATIN (LIPITOR) 20 MG TABLET    TAKE 1 TABLET(20 MG) BY MOUTH DAILY   CARBOXYMETHYLCELLULOSE SODIUM (EYE DROPS OP)    Apply to eye.   CARVEDILOL (COREG) 3.125 MG TABLET    TAKE 1 TABLET(3.125 MG) BY MOUTH TWICE DAILY WITH A MEAL   CONJUGATED ESTROGENS (PREMARIN) VAGINAL CREAM    One application three time per week   CRANBERRY SOFT PO    Take 1 tablet by mouth daily. Gummy   DOCUSATE SODIUM (COLACE) 100 MG CAPSULE    Take 100 mg by mouth every other day.   FEROSUL 325 (65 FE) MG TABLET    TAKE 1 TABLET(325 MG) BY MOUTH IN THE MORNING AND AT BEDTIME   GLIMEPIRIDE (AMARYL) 4 MG TABLET    TAKE 1 TABLET(4 MG) BY MOUTH IN THE MORNING AND AT BEDTIME   GLUCOSE BLOOD TEST STRIP    Check blood sugar twice daily as directed E11.9   JANUVIA 100 MG TABLET    TAKE 1 TABLET BY MOUTH DAILY   LANCETS (ONETOUCH ULTRASOFT) LANCETS    Check blood sugar twice daily DX E11.9   LORATADINE (CLARITIN) 10 MG TABLET    TAKE 1 TABLET(10 MG) BY MOUTH DAILY   LOSARTAN (COZAAR) 25 MG TABLET    TAKE 1 TABLET(25 MG) BY MOUTH DAILY   METFORMIN (GLUCOPHAGE) 500 MG TABLET    TAKE 1 TABLET(500 MG) BY MOUTH TWICE DAILY WITH A MEAL   MYRBETRIQ 25 MG TB24 TABLET    TAKE 1 TABLET(25 MG) BY MOUTH DAILY   PANTOPRAZOLE (PROTONIX) 40 MG TABLET    Take 1 tablet (40 mg total) by mouth 2 (two) times daily before a meal.   PSYLLIUM (METAMUCIL) 58.6 % PACKET    Take 1 packet by mouth daily.   SALINE NASAL MIST NA    Place 1 spray into the nose as needed.   Modified Medications   No medications on file  Discontinued Medications   No medications on file    Physical Exam:  Vitals:   04/28/20 1125  BP: 118/76  Pulse: 64  Temp: 97.8 F (36.6 C)  TempSrc: Temporal  Weight: 113 lb (51.3 kg)  Height: 5\' 1"  (1.549 m)   Body mass index is 21.35 kg/m. Wt Readings from Last 3 Encounters:  04/28/20 113 lb (51.3 kg)  03/28/20 112 lb (50.8 kg)  10/29/19 113 lb (51.3 kg)     Physical Exam Constitutional:      General: She is not in acute distress.    Appearance: She is well-developed. She is not diaphoretic.  HENT:     Head: Normocephalic and atraumatic.     Mouth/Throat:     Pharynx: No oropharyngeal exudate.  Eyes:     Conjunctiva/sclera: Conjunctivae normal.     Pupils: Pupils are equal, round, and reactive to light.  Cardiovascular:     Rate and Rhythm:  Normal rate and regular rhythm.     Heart sounds: Normal heart sounds.  Pulmonary:     Effort: Pulmonary effort is normal.     Breath sounds: Normal breath sounds.  Abdominal:     General: Bowel sounds are normal.     Palpations: Abdomen is soft.  Musculoskeletal:        General: No tenderness.     Cervical back: Normal range of motion and neck supple.  Skin:    General: Skin is warm and dry.  Neurological:     Mental Status: She is alert and oriented to person, place, and time. Mental status is at baseline.  Psychiatric:        Mood and Affect: Mood normal.        Behavior: Behavior normal.     Labs reviewed: Basic Metabolic Panel: Recent Labs    05/01/19 0810 06/01/19 0907 07/13/19 1542 10/29/19 1200 03/28/20 1429  NA 138  --  139 138 139  K 3.6  --  3.8 3.9 4.4  CL 101  --  102 104 105  CO2 27  --  27 22 25   GLUCOSE 155*  --  74 135 72  BUN 20  --  31* 43* 40*  CREATININE 0.81  --  0.85 1.02* 1.10*  CALCIUM 9.6 10.4 10.1 10.1 10.3  MG  --  1.6  --   --   --   PHOS  --  2.4  --   --   --   TSH 1.75  --   --   --   --    Liver Function Tests: Recent Labs    05/01/19 0810 10/29/19 1200 03/28/20 1429  AST 14 14 9*  ALT 12 11 11   BILITOT 0.6 0.4 0.3  PROT 6.6 6.9 6.6   No results for input(s): LIPASE, AMYLASE in the last 8760 hours. No results for input(s): AMMONIA in the last 8760 hours. CBC: Recent Labs    05/01/19 0810 10/29/19 1200 03/28/20 1429  WBC 6.6 5.9 5.6  NEUTROABS 4,448 4,130 3,573  HGB 10.6* 10.3* 9.1*  HCT 33.3* 32.9* 28.6*  MCV 87.2 93.5  88.0  PLT 160 170 194   Lipid Panel: Recent Labs    05/01/19 0810 03/28/20 1429  CHOL 147 116  HDL 56 58  LDLCALC 75 44  TRIG 84 64  CHOLHDL 2.6 2.0   TSH: Recent Labs    05/01/19 0810  TSH 1.75   A1C: Lab Results  Component Value Date   HGBA1C 6.3 (H) 03/28/2020     Assessment/Plan 1. Vaginal dryness -has not been taking premarin, also noted to have some dysuria since she has been off - conjugated estrogens (PREMARIN) vaginal cream; One application three time per week  Dispense: 42.5 g; Refill: 12  2. Overactive bladder -ongoing, increase frequency noted recently  - mirabegron ER (MYRBETRIQ) 50 MG TB24 tablet; Take 1 tablet (50 mg total) by mouth daily.  Dispense: 90 tablet; Refill: 1 - POC Urinalysis Dipstick  3. Iron deficiency -will follow up CBC, also to get stool for FOB - CBC with Differential/Platelet  4. Chronic idiopathic constipation -stable. Continues on OTC  5. Type 2 diabetes mellitus with diabetic neuropathy, without long-term current use of insulin (HCC) -stable, no hypoglycemia. Continue on current regimen. Encouraged dietary compliance, routine foot care/monitoring and to keep up with diabetic eye exams through ophthalmology   6. Urinary tract infection with hematuria, site unspecified -culture report found from home health. Noted  to have greater than 100,000 colonies of klebsiaella pneumoniae, sensitive to Cipro.  - ciprofloxacin (CIPRO) 500 MG tablet; Take 1 tablet (500 mg total) by mouth 2 (two) times daily.  Dispense: 14 tablet; Refill: 0 - saccharomyces boulardii (FLORASTOR) 250 MG capsule; Take 1 capsule (250 mg total) by mouth 2 (two) times daily.  Dispense: 20 capsule; Refill: 0   Next appt: 6 months.  Janene Harvey. Biagio Borg  [facility] & Adult Medicine [phone]

## 2020-05-01 ENCOUNTER — Other Ambulatory Visit: Payer: Self-pay

## 2020-05-01 ENCOUNTER — Other Ambulatory Visit: Payer: Self-pay | Admitting: Nurse Practitioner

## 2020-05-01 DIAGNOSIS — I10 Essential (primary) hypertension: Secondary | ICD-10-CM

## 2020-05-01 DIAGNOSIS — E1142 Type 2 diabetes mellitus with diabetic polyneuropathy: Secondary | ICD-10-CM

## 2020-05-01 MED ORDER — PANTOPRAZOLE SODIUM 40 MG PO TBEC: 40.0000 mg | DELAYED_RELEASE_TABLET | Freq: Two times a day (BID) | ORAL | 3 refills | Status: DC

## 2020-05-02 ENCOUNTER — Telehealth: Payer: Self-pay

## 2020-05-02 DIAGNOSIS — R413 Other amnesia: Secondary | ICD-10-CM

## 2020-05-02 DIAGNOSIS — M19041 Primary osteoarthritis, right hand: Secondary | ICD-10-CM

## 2020-05-02 DIAGNOSIS — N3281 Overactive bladder: Secondary | ICD-10-CM

## 2020-05-02 NOTE — Telephone Encounter (Signed)
Incoming call received from Radovan, occupational therapist with [facility] requesting verbal orders for OT 1 time weekly for 4 weeks, then 2 times weekly x 2 weeks. Verbal order given per standing protocol for [facility].  Radovan would also like an order for a Bedside Commode sent to [facility]   Order pending for Sharon Seller, NP to review, associate diagnosis, and sign. Once signed I will fax to [phone]

## 2020-05-05 ENCOUNTER — Telehealth: Payer: Self-pay | Admitting: *Deleted

## 2020-05-05 MED ORDER — ESTROGENS, CONJUGATED 0.625 MG/GM VA CREA: TOPICAL_CREAM | VAGINAL | 12 refills | Status: AC

## 2020-05-05 NOTE — Telephone Encounter (Signed)
Stacey Ritter with Pharmacy called wanting to clarify the directions for the Premarin.   Wants to know if it is a full applicator three times per week.   Please Advise to send in new Rx to [facility] with Clarification.

## 2020-05-06 ENCOUNTER — Other Ambulatory Visit: Payer: Self-pay | Admitting: *Deleted

## 2020-05-06 NOTE — Patient Outreach (Signed)
[facility]) Care Management  05/06/2020  Stacey Ritter [birth year] [id number]   Telephone Assessment  RN did not receive a call back from pt's daughter [name] concerning [facility] pending services. RN called back today to verify however caregiver currently on her job and requested to call RN case manager back.   RN awaiting a call back from pt's daughter.  [name], RN Care Management Coordinator [facility] Main Office [phone]

## 2020-05-10 DIAGNOSIS — R413 Other amnesia: Secondary | ICD-10-CM | POA: Diagnosis not present

## 2020-05-10 DIAGNOSIS — D509 Iron deficiency anemia, unspecified: Secondary | ICD-10-CM | POA: Diagnosis not present

## 2020-05-10 DIAGNOSIS — I350 Nonrheumatic aortic (valve) stenosis: Secondary | ICD-10-CM | POA: Diagnosis not present

## 2020-05-10 DIAGNOSIS — I10 Essential (primary) hypertension: Secondary | ICD-10-CM | POA: Diagnosis not present

## 2020-05-10 DIAGNOSIS — N3281 Overactive bladder: Secondary | ICD-10-CM | POA: Diagnosis not present

## 2020-05-10 DIAGNOSIS — K219 Gastro-esophageal reflux disease without esophagitis: Secondary | ICD-10-CM | POA: Diagnosis not present

## 2020-05-10 DIAGNOSIS — M19042 Primary osteoarthritis, left hand: Secondary | ICD-10-CM | POA: Diagnosis not present

## 2020-05-10 DIAGNOSIS — H353131 Nonexudative age-related macular degeneration, bilateral, early dry stage: Secondary | ICD-10-CM | POA: Diagnosis not present

## 2020-05-10 DIAGNOSIS — R634 Abnormal weight loss: Secondary | ICD-10-CM | POA: Diagnosis not present

## 2020-05-10 DIAGNOSIS — H547 Unspecified visual loss: Secondary | ICD-10-CM | POA: Diagnosis not present

## 2020-05-10 DIAGNOSIS — E1140 Type 2 diabetes mellitus with diabetic neuropathy, unspecified: Secondary | ICD-10-CM | POA: Diagnosis not present

## 2020-05-10 DIAGNOSIS — M19041 Primary osteoarthritis, right hand: Secondary | ICD-10-CM | POA: Diagnosis not present

## 2020-05-13 ENCOUNTER — Telehealth: Payer: Self-pay | Admitting: *Deleted

## 2020-05-13 NOTE — Telephone Encounter (Signed)
Radovan with [facility] called requesting verbal orders to extend Home Health 1x4.  Verbal orders given.

## 2020-05-15 ENCOUNTER — Encounter (INDEPENDENT_AMBULATORY_CARE_PROVIDER_SITE_OTHER): Payer: Medicare Other | Admitting: Ophthalmology

## 2020-05-16 ENCOUNTER — Encounter (INDEPENDENT_AMBULATORY_CARE_PROVIDER_SITE_OTHER): Payer: Medicare Other | Admitting: Ophthalmology

## 2020-05-19 ENCOUNTER — Encounter (INDEPENDENT_AMBULATORY_CARE_PROVIDER_SITE_OTHER): Payer: Medicare Other | Admitting: Ophthalmology

## 2020-05-23 NOTE — Telephone Encounter (Signed)
Radovan with [facility] called and stated that [facility] did not receive Bedside Commode order.  Orders refaxed to [facility].

## 2020-06-06 ENCOUNTER — Telehealth: Payer: Self-pay

## 2020-06-06 NOTE — Telephone Encounter (Signed)
Incoming call received from Occupational Therapist stating patient has met her therapy goals and will be discharging today.  FYI

## 2020-06-06 NOTE — Telephone Encounter (Signed)
Thank you  for update

## 2020-06-12 ENCOUNTER — Telehealth: Payer: Self-pay | Admitting: *Deleted

## 2020-06-12 NOTE — Telephone Encounter (Signed)
Shanda Bumps brought [facility] Forms that need to be filled out for patient but states patient needs an appointment.  Called and spoke with daughter and they cannot wait to see Shanda Bumps at her next available. Scheduled an appointment with Dr. Hyacinth Meeker for next week.   Forms placed in Filing Cabinet under "8"

## 2020-06-13 DIAGNOSIS — M810 Age-related osteoporosis without current pathological fracture: Secondary | ICD-10-CM | POA: Diagnosis not present

## 2020-06-13 DIAGNOSIS — N3281 Overactive bladder: Secondary | ICD-10-CM | POA: Diagnosis not present

## 2020-06-18 ENCOUNTER — Ambulatory Visit: Payer: Self-pay | Admitting: Family Medicine

## 2020-06-24 ENCOUNTER — Other Ambulatory Visit: Payer: Self-pay

## 2020-06-24 ENCOUNTER — Encounter: Payer: Self-pay | Admitting: Family Medicine

## 2020-06-24 ENCOUNTER — Ambulatory Visit (INDEPENDENT_AMBULATORY_CARE_PROVIDER_SITE_OTHER): Payer: Medicare Other | Admitting: Family Medicine

## 2020-06-24 ENCOUNTER — Telehealth: Payer: Self-pay | Admitting: Family Medicine

## 2020-06-24 VITALS — BP 118/58 | HR 88 | Temp 97.7°F | Ht 61.0 in | Wt 118.2 lb

## 2020-06-24 DIAGNOSIS — R609 Edema, unspecified: Secondary | ICD-10-CM

## 2020-06-24 DIAGNOSIS — E1140 Type 2 diabetes mellitus with diabetic neuropathy, unspecified: Secondary | ICD-10-CM

## 2020-06-24 NOTE — Progress Notes (Signed)
Provider:  Jacalyn Lefevre, MD  Careteam: Patient Care Team: Sharon Seller, NP as PCP - General (Geriatric Medicine) Rollene Rotunda, MD as PCP - Cardiology (Cardiology) Alejandro Mulling, RN as [facility] Care Management  PLACE OF SERVICE:  [facility] CL[facility]  Advanced Directive information    Allergies  Allergen Reactions   Ampicillin Rash   Elemental Sulfur Rash   Penicillins Rash    Has patient had a PCN reaction causing immediate rash, facial/tongue/throat swelling, SOB or lightheadedness with hypotension: Yes Has patient had a PCN reaction causing severe rash involving mucus membranes or skin necrosis: No Has patient had a PCN reaction that required hospitalization: No Has patient had a PCN reaction occurring within the last 10 years: No If all of the above answers are "NO", then may proceed with Cephalosporin use.     Chief Complaint  Patient presents with   Acute Visit    Patient presents today for bilateral feet, hands and ankle swelling for about 2 weeks now. She reports  pain with numbness in feet.      HPI: Patient is a 87 y.o. female.  She presents today with 2-week history of swelling in her feet as well as numbness in her feet.  She also has some swelling in her fingers consistent with arthritis.  She stays with her daughter who feels that her inactivity and sitting with her feet on the floor contributes to the swelling.  In reviewing her labs her renal function has probably adequate and not a likely source of swelling.  She does have a history of aortic valve disease and has seen cardiology in the past.  She may have some contribution from heart failure but there is no shortness of breath.  She denies use of salt. Also complains of abdominal swelling and bloating.  She is on iron supplement which may contribute to her constipation  Review of Systems:  Review of Systems  Constitutional:  Positive for malaise/fatigue.  Respiratory: Negative.     Cardiovascular:  Positive for leg swelling.  Gastrointestinal:  Positive for abdominal pain and constipation.  Genitourinary:  Positive for frequency.  Neurological: Negative.   Psychiatric/Behavioral:  Positive for memory loss.   All other systems reviewed and are negative.  Past Medical History:  Diagnosis Date   Anemia    Blindness    Diabetes mellitus without complication (HCC)    Disorder of phosphorus metabolism    GERD (gastroesophageal reflux disease)    High blood pressure    Hypercalcemia    Macular degeneration    Neuropathy    Non-rheumatic aortic stenosis    Osteoporosis    Past Surgical History:  Procedure Laterality Date   APPENDECTOMY  1953   CATARACT EXTRACTION Right 11/17/1982   Dr. Loney Hering - Model 108B / 15D   CATARACT EXTRACTION Left 12/11/1991   Dr. Swazil[place] - Model [id number] / 14.5D   COLONOSCOPY     TONSILLECTOMY  1939   Social History:   reports that she has never smoked. She has never used smokeless tobacco. She reports previous alcohol use. She reports that she does not use drugs.  Family History  Problem Relation Age of Onset   Psoriasis Father        Of the liver    Osteoporosis Sister    Lung cancer Brother    Osteoporosis Sister    Colon cancer Neg Hx    Stomach cancer Neg Hx    Esophageal cancer Neg Hx  Pancreatic cancer Neg Hx    Hyperparathyroidism Neg Hx     Medications: Patient's Medications  New Prescriptions   No medications on file  Previous Medications   ASCORBIC ACID (VITAMIN C) 125 MG CHEW    Chew by mouth.   ATORVASTATIN (LIPITOR) 20 MG TABLET    TAKE 1 TABLET(20 MG) BY MOUTH DAILY   CARBOXYMETHYLCELLULOSE SODIUM (EYE DROPS OP)    Apply to eye.   CARVEDILOL (COREG) 3.125 MG TABLET    TAKE 1 TABLET(3.125 MG) BY MOUTH TWICE DAILY WITH A MEAL   CIPROFLOXACIN (CIPRO) 500 MG TABLET    Take 1 tablet (500 mg total) by mouth 2 (two) times daily.   CONJUGATED ESTROGENS (PREMARIN) VAGINAL CREAM    0.625 mg per g, place 1 application  vaginally three times weekly   CRANBERRY SOFT PO    Take 1 tablet by mouth daily. Gummy   DOCUSATE SODIUM (COLACE) 100 MG CAPSULE    Take 100 mg by mouth every other day.   FEROSUL 325 (65 FE) MG TABLET    TAKE 1 TABLET(325 MG) BY MOUTH IN THE MORNING AND AT BEDTIME   GLIMEPIRIDE (AMARYL) 4 MG TABLET    TAKE 1 TABLET(4 MG) BY MOUTH IN THE MORNING AND AT BEDTIME   GLUCOSE BLOOD TEST STRIP    Check blood sugar twice daily as directed E11.9   JANUVIA 100 MG TABLET    TAKE 1 TABLET BY MOUTH DAILY   LANCETS (ONETOUCH ULTRASOFT) LANCETS    Check blood sugar twice daily DX E11.9   LORATADINE (CLARITIN) 10 MG TABLET    TAKE 1 TABLET(10 MG) BY MOUTH DAILY   LOSARTAN (COZAAR) 25 MG TABLET    TAKE 1 TABLET(25 MG) BY MOUTH DAILY   METFORMIN (GLUCOPHAGE) 500 MG TABLET    TAKE 1 TABLET(500 MG) BY MOUTH TWICE DAILY WITH A MEAL   MIRABEGRON ER (MYRBETRIQ) 50 MG TB24 TABLET    Take 1 tablet (50 mg total) by mouth daily.   PANTOPRAZOLE (PROTONIX) 40 MG TABLET    Take 1 tablet (40 mg total) by mouth 2 (two) times daily before a meal.   PSYLLIUM (METAMUCIL) 58.6 % PACKET    Take 1 packet by mouth daily.   SACCHAROMYCES BOULARDII (FLORASTOR) 250 MG CAPSULE    Take 1 capsule (250 mg total) by mouth 2 (two) times daily.   SALINE NASAL MIST NA    Place 1 spray into the nose as needed.   Modified Medications   No medications on file  Discontinued Medications   No medications on file    Physical Exam:  Vitals:   06/24/20 1501  BP: (!) 118/58  Pulse: 88  Temp: 97.7 F (36.5 C)  TempSrc: Temporal  SpO2: 95%  Weight: 118 lb 3.2 oz (53.6 kg)  Height: 5\' 1"  (1.549 m)   Body mass index is 22.33 kg/m. Wt Readings from Last 3 Encounters:  06/24/20 118 lb 3.2 oz (53.6 kg)  04/28/20 113 lb (51.3 kg)  03/28/20 112 lb (50.8 kg)    Physical Exam Vitals and nursing note reviewed.  Constitutional:      Appearance: Normal appearance.  Cardiovascular:     Rate and Rhythm: Normal rate.     Heart sounds: Murmur  heard.  Pulmonary:     Effort: Pulmonary effort is normal.     Breath sounds: Normal breath sounds.  Abdominal:     General: There is distension.     Comments: Decreased bowel sounds  Musculoskeletal:  Right lower leg: Edema present.     Left lower leg: Edema present.     Comments: Both ankles and feet are 3+ swollen.  She does have some varicose veins bilaterally  Neurological:     General: No focal deficit present.     Mental Status: She is alert and oriented to person, place, and time.  Psychiatric:        Mood and Affect: Mood normal.        Behavior: Behavior normal.        Thought Content: Thought content normal.    Labs reviewed: Basic Metabolic Panel: Recent Labs    07/13/19 1542 10/29/19 1200 03/28/20 1429  NA 139 138 139  K 3.8 3.9 4.4  CL 102 104 105  CO2 27 22 25   GLUCOSE 74 135 72  BUN 31* 43* 40*  CREATININE 0.85 1.02* 1.10*  CALCIUM 10.1 10.1 10.3   Liver Function Tests: Recent Labs    10/29/19 1200 03/28/20 1429  AST 14 9*  ALT 11 11  BILITOT 0.4 0.3  PROT 6.9 6.6   No results for input(s): LIPASE, AMYLASE in the last 8760 hours. No results for input(s): AMMONIA in the last 8760 hours. CBC: Recent Labs    10/29/19 1200 03/28/20 1429 04/28/20 1156  WBC 5.9 5.6 7.5  NEUTROABS 4,130 3,573 5,543  HGB 10.3* 9.1* 8.3*  HCT 32.9* 28.6* 27.4*  MCV 93.5 88.0 87.5  PLT 170 194 227   Lipid Panel: Recent Labs    03/28/20 1429  CHOL 116  HDL 58  LDLCALC 44  TRIG 64  CHOLHDL 2.0   TSH: No results for input(s): TSH in the last 8760 hours. A1C: Lab Results  Component Value Date   HGBA1C 6.3 (H) 03/28/2020     Assessment/Plan 1. Type 2 diabetes mellitus with diabetic neuropathy, without long-term current use of insulin (HCC) Patient currently on Januvia, metformin and Amaryl.  Last A1c was 6.3.  Would like to stop Amaryl in hopes of streamlining some of her medicines.  Concerned about hypoglycemia with sulfonylurea    2.  Peripheral edema Probably related to venous insufficiency.  There may be some contributions from heart and kidneys as well.  Since he already complains of urinary frequency did not want to consider diuretic even on a as needed basis.  Continue with watching salt elevation   03/30/2020, MD [facility] & Adult Medicine [phone]

## 2020-06-25 NOTE — Telephone Encounter (Signed)
ERROR

## 2020-06-27 ENCOUNTER — Telehealth: Payer: Self-pay | Admitting: Nurse Practitioner

## 2020-06-27 ENCOUNTER — Other Ambulatory Visit: Payer: Medicare Other | Admitting: *Deleted

## 2020-06-27 NOTE — Telephone Encounter (Signed)
Patients daughter [name] called and wanted [place] to fax over the results of Margarets TB test she had done on 10/29/2019. I faxed the results to [facility] at [phone] and had attention Scarlett Presto on the cover.  Done on 06/27/20-TM

## 2020-06-27 NOTE — Patient Outreach (Signed)
[facility]) Care Management  06/27/2020  Stacey Ritter 06-17-1926 [id number]   Telephone Assessment-Unsuccessful  RN attempted outreach due to no call back as discussed on the last outreach call. RN able to leave a HIPAA approved voice message requesting a call back.   Will also send outreach letter and follow up over the next month with another outreach call.  [name], RN Care Management Coordinator [facility] Main Office [phone]

## 2020-07-04 ENCOUNTER — Telehealth: Payer: Self-pay | Admitting: *Deleted

## 2020-07-04 NOTE — Telephone Encounter (Signed)
Yes these are okay to give

## 2020-07-04 NOTE — Telephone Encounter (Signed)
Stacey Ritter with [employer].

## 2020-07-04 NOTE — Telephone Encounter (Signed)
Stacey Ritter with [name] called and stated that they received a FL2 Form from our office but it did not list some OTC Medications that patient takes and they need a verbal ok to give to patient: Tylenol 650mg  one twice daily Tums one with meals Ireds One daily Ensure Max Protein One with meals.   These are not in patient's current medication list. Is it ok to give a verbal for these.   Please Advise.

## 2020-07-29 ENCOUNTER — Ambulatory Visit: Payer: Self-pay | Admitting: *Deleted

## 2020-07-30 ENCOUNTER — Other Ambulatory Visit: Payer: Self-pay | Admitting: Nurse Practitioner

## 2020-07-30 ENCOUNTER — Other Ambulatory Visit: Payer: Self-pay | Admitting: *Deleted

## 2020-07-30 DIAGNOSIS — E1142 Type 2 diabetes mellitus with diabetic polyneuropathy: Secondary | ICD-10-CM

## 2020-07-30 MED ORDER — PANTOPRAZOLE SODIUM 40 MG PO TBEC: 40.0000 mg | DELAYED_RELEASE_TABLET | Freq: Two times a day (BID) | ORAL | 3 refills | Status: DC

## 2020-07-31 ENCOUNTER — Telehealth: Payer: Self-pay | Admitting: *Deleted

## 2020-07-31 NOTE — Telephone Encounter (Signed)
Given her advance age she is high risk for falls.I would not recommend any wine.

## 2020-07-31 NOTE — Telephone Encounter (Signed)
Faxed response to [place] with [facility] at Fax: [phone]

## 2020-07-31 NOTE — Telephone Encounter (Signed)
Stacey Ritter with [name] called and stated that the facility is having a Wine and Art this Evening and daughter was wondering if it would be ok if patient could participate in having a glass of wine.   Stacey Ritter is requesting an order for a glass of wine/day.   Please Advise.

## 2020-08-02 ENCOUNTER — Telehealth: Payer: Self-pay | Admitting: Adult Health

## 2020-08-02 MED ORDER — NIRMATRELVIR/RITONAVIR (PAXLOVID) TABLET (RENAL DOSING): 2.0000 | ORAL_TABLET | Freq: Two times a day (BID) | ORAL | 0 refills | Status: AC

## 2020-08-02 MED ORDER — GUAIFENESIN-DM 100-10 MG/5ML PO SYRP: 5.0000 mL | ORAL_SOLUTION | ORAL | 0 refills | Status: AC | PRN

## 2020-08-02 NOTE — Telephone Encounter (Signed)
Resident's daughter called to report that Stacey Ritter tested positive for Covid on 7/22.  She has a cough and fever but no shortness of breath. I spoke with the med tech at [facility] and she confirmed that the patient is not short of breath. I have sent a prescription to [facility] for paxlovid and to hold lipitor while on paxlovid. I also sent an order for Robitussin per their request.  I am sending orders as well to the nurse in a secure email.

## 2020-08-04 ENCOUNTER — Other Ambulatory Visit: Payer: Self-pay | Admitting: *Deleted

## 2020-08-04 NOTE — Patient Outreach (Signed)
[facility]) Care Management  08/04/2020  Stacey Ritter 1926-10-07 [id number]   Case Closure  Daughter inform [facility] office that pt has been placed into a senior living facility. Case will be closed and provider notified pt is in an external program.  [name], RN Care Management Coordinator [facility] Main Office [phone]

## 2020-08-06 ENCOUNTER — Telehealth: Payer: Self-pay | Admitting: *Deleted

## 2020-08-06 NOTE — Telephone Encounter (Signed)
Patty, daughter, called and left message on Clinical intake regarding concerns with patient's medications.   Tried calling daughter and LMOM to [facility].

## 2020-08-06 NOTE — Telephone Encounter (Signed)
We do not have miralax on her list, we can send order over to have it as needed, would recommend not taking imodium as this creates a lot of issues when given but to STOP all softeners or laxatives when having diarrhea.

## 2020-08-06 NOTE — Telephone Encounter (Signed)
Patty, daughter, called back and stated that patient has been having Diarrhea on and off for about 2 weeks,   Daughter is wanting Order changed to:  Miralax twice daily PRN instead of bid And wants added: Immodium One daily PRN.   Wants faxed to [name] Fax: [phone] Please Advise.

## 2020-08-06 NOTE — Telephone Encounter (Signed)
Printed and faxed to [facility].  

## 2020-08-14 ENCOUNTER — Other Ambulatory Visit: Payer: Self-pay | Admitting: Nurse Practitioner

## 2020-08-14 NOTE — Telephone Encounter (Signed)
Patient has request refill on medication "Glimepiride". Patient last refill on medication was January 2022. Patient is due for refills. Medication has Allergy Contraindication/High Risk Warning. Medication pend and sent to PCP Janyth Contes Janene Harvey, NP for approval. Please Advise.

## 2020-08-19 DIAGNOSIS — E119 Type 2 diabetes mellitus without complications: Secondary | ICD-10-CM | POA: Diagnosis not present

## 2020-08-19 DIAGNOSIS — K219 Gastro-esophageal reflux disease without esophagitis: Secondary | ICD-10-CM | POA: Diagnosis not present

## 2020-08-19 DIAGNOSIS — I10 Essential (primary) hypertension: Secondary | ICD-10-CM | POA: Diagnosis not present

## 2020-08-19 DIAGNOSIS — K5901 Slow transit constipation: Secondary | ICD-10-CM | POA: Diagnosis not present

## 2020-08-21 ENCOUNTER — Telehealth: Payer: Self-pay | Admitting: *Deleted

## 2020-08-21 NOTE — Telephone Encounter (Signed)
Printed and faxed

## 2020-08-21 NOTE — Telephone Encounter (Signed)
Okay to send order for Ua c&s, Please get updated vitals.

## 2020-08-21 NOTE — Telephone Encounter (Signed)
[place] with [facility] called and stated that patient will not leave her room. Stated that she is complaining with Frequent Urination and Burning. No other symptoms noted.   Nurse is requesting an order for U/A and Culture to be faxed to them to collect. Fax:[phone]

## 2020-08-26 DIAGNOSIS — N390 Urinary tract infection, site not specified: Secondary | ICD-10-CM | POA: Diagnosis not present

## 2020-08-26 DIAGNOSIS — Z79899 Other long term (current) drug therapy: Secondary | ICD-10-CM | POA: Diagnosis not present

## 2020-08-29 DIAGNOSIS — E559 Vitamin D deficiency, unspecified: Secondary | ICD-10-CM | POA: Diagnosis not present

## 2020-08-29 DIAGNOSIS — K5901 Slow transit constipation: Secondary | ICD-10-CM | POA: Diagnosis not present

## 2020-08-29 DIAGNOSIS — I10 Essential (primary) hypertension: Secondary | ICD-10-CM | POA: Diagnosis not present

## 2020-09-01 ENCOUNTER — Ambulatory Visit: Payer: Medicare Other | Admitting: Nurse Practitioner

## 2020-09-16 DIAGNOSIS — E119 Type 2 diabetes mellitus without complications: Secondary | ICD-10-CM | POA: Diagnosis not present

## 2020-09-16 DIAGNOSIS — I129 Hypertensive chronic kidney disease with stage 1 through stage 4 chronic kidney disease, or unspecified chronic kidney disease: Secondary | ICD-10-CM | POA: Diagnosis not present

## 2020-09-16 DIAGNOSIS — D509 Iron deficiency anemia, unspecified: Secondary | ICD-10-CM | POA: Diagnosis not present

## 2020-09-16 DIAGNOSIS — N1831 Chronic kidney disease, stage 3a: Secondary | ICD-10-CM | POA: Diagnosis not present

## 2020-10-02 DIAGNOSIS — M19041 Primary osteoarthritis, right hand: Secondary | ICD-10-CM | POA: Diagnosis not present

## 2020-10-02 DIAGNOSIS — N1831 Chronic kidney disease, stage 3a: Secondary | ICD-10-CM | POA: Diagnosis not present

## 2020-10-02 DIAGNOSIS — N3281 Overactive bladder: Secondary | ICD-10-CM | POA: Diagnosis not present

## 2020-10-02 DIAGNOSIS — E1122 Type 2 diabetes mellitus with diabetic chronic kidney disease: Secondary | ICD-10-CM | POA: Diagnosis not present

## 2020-10-07 DIAGNOSIS — R350 Frequency of micturition: Secondary | ICD-10-CM | POA: Diagnosis not present

## 2020-10-07 DIAGNOSIS — I10 Essential (primary) hypertension: Secondary | ICD-10-CM | POA: Diagnosis not present

## 2020-10-07 DIAGNOSIS — I498 Other specified cardiac arrhythmias: Secondary | ICD-10-CM | POA: Diagnosis not present

## 2020-10-07 DIAGNOSIS — Y998 Other external cause status: Secondary | ICD-10-CM | POA: Diagnosis not present

## 2020-10-07 DIAGNOSIS — K8020 Calculus of gallbladder without cholecystitis without obstruction: Secondary | ICD-10-CM | POA: Diagnosis not present

## 2020-10-07 DIAGNOSIS — M545 Low back pain, unspecified: Secondary | ICD-10-CM | POA: Diagnosis not present

## 2020-10-07 DIAGNOSIS — M4856XA Collapsed vertebra, not elsewhere classified, lumbar region, initial encounter for fracture: Secondary | ICD-10-CM | POA: Diagnosis not present

## 2020-10-07 DIAGNOSIS — D6489 Other specified anemias: Secondary | ICD-10-CM | POA: Diagnosis not present

## 2020-10-07 DIAGNOSIS — N3090 Cystitis, unspecified without hematuria: Secondary | ICD-10-CM | POA: Diagnosis not present

## 2020-10-07 DIAGNOSIS — K449 Diaphragmatic hernia without obstruction or gangrene: Secondary | ICD-10-CM | POA: Diagnosis not present

## 2020-10-07 DIAGNOSIS — M549 Dorsalgia, unspecified: Secondary | ICD-10-CM | POA: Diagnosis not present

## 2020-10-07 DIAGNOSIS — R000 Tachycardia, unspecified: Secondary | ICD-10-CM | POA: Diagnosis not present

## 2020-10-07 DIAGNOSIS — X58XXXA Exposure to other specified factors, initial encounter: Secondary | ICD-10-CM | POA: Diagnosis not present

## 2020-10-07 DIAGNOSIS — S39012A Strain of muscle, fascia and tendon of lower back, initial encounter: Secondary | ICD-10-CM | POA: Diagnosis not present

## 2020-10-08 DIAGNOSIS — M549 Dorsalgia, unspecified: Secondary | ICD-10-CM | POA: Diagnosis not present

## 2020-10-08 DIAGNOSIS — K449 Diaphragmatic hernia without obstruction or gangrene: Secondary | ICD-10-CM | POA: Diagnosis not present

## 2020-10-08 DIAGNOSIS — M4856XA Collapsed vertebra, not elsewhere classified, lumbar region, initial encounter for fracture: Secondary | ICD-10-CM | POA: Diagnosis not present

## 2020-10-08 DIAGNOSIS — K8020 Calculus of gallbladder without cholecystitis without obstruction: Secondary | ICD-10-CM | POA: Diagnosis not present

## 2020-10-08 DIAGNOSIS — M5489 Other dorsalgia: Secondary | ICD-10-CM | POA: Diagnosis not present

## 2020-10-08 DIAGNOSIS — I498 Other specified cardiac arrhythmias: Secondary | ICD-10-CM | POA: Diagnosis not present

## 2020-10-08 DIAGNOSIS — R350 Frequency of micturition: Secondary | ICD-10-CM | POA: Diagnosis not present

## 2020-10-08 DIAGNOSIS — Z7401 Bed confinement status: Secondary | ICD-10-CM | POA: Diagnosis not present

## 2020-10-08 DIAGNOSIS — R4182 Altered mental status, unspecified: Secondary | ICD-10-CM | POA: Diagnosis not present

## 2020-10-14 DIAGNOSIS — R600 Localized edema: Secondary | ICD-10-CM | POA: Diagnosis not present

## 2020-10-14 DIAGNOSIS — I739 Peripheral vascular disease, unspecified: Secondary | ICD-10-CM | POA: Diagnosis not present

## 2020-10-14 DIAGNOSIS — I129 Hypertensive chronic kidney disease with stage 1 through stage 4 chronic kidney disease, or unspecified chronic kidney disease: Secondary | ICD-10-CM | POA: Diagnosis not present

## 2020-10-14 DIAGNOSIS — N1831 Chronic kidney disease, stage 3a: Secondary | ICD-10-CM | POA: Diagnosis not present

## 2020-10-22 DIAGNOSIS — R270 Ataxia, unspecified: Secondary | ICD-10-CM | POA: Diagnosis not present

## 2020-10-22 DIAGNOSIS — M79661 Pain in right lower leg: Secondary | ICD-10-CM | POA: Diagnosis not present

## 2020-10-22 DIAGNOSIS — M79662 Pain in left lower leg: Secondary | ICD-10-CM | POA: Diagnosis not present

## 2020-10-22 DIAGNOSIS — M6281 Muscle weakness (generalized): Secondary | ICD-10-CM | POA: Diagnosis not present

## 2020-11-01 DIAGNOSIS — Z79899 Other long term (current) drug therapy: Secondary | ICD-10-CM | POA: Diagnosis not present

## 2020-11-01 DIAGNOSIS — N390 Urinary tract infection, site not specified: Secondary | ICD-10-CM | POA: Diagnosis not present

## 2020-11-04 ENCOUNTER — Other Ambulatory Visit: Payer: Self-pay | Admitting: Nurse Practitioner

## 2020-11-04 DIAGNOSIS — I10 Essential (primary) hypertension: Secondary | ICD-10-CM

## 2020-11-04 DIAGNOSIS — F411 Generalized anxiety disorder: Secondary | ICD-10-CM | POA: Diagnosis not present

## 2020-11-04 DIAGNOSIS — E1142 Type 2 diabetes mellitus with diabetic polyneuropathy: Secondary | ICD-10-CM

## 2020-11-05 ENCOUNTER — Other Ambulatory Visit: Payer: Self-pay | Admitting: *Deleted

## 2020-11-05 MED ORDER — PANTOPRAZOLE SODIUM 40 MG PO TBEC: 40.0000 mg | DELAYED_RELEASE_TABLET | Freq: Two times a day (BID) | ORAL | 3 refills | Status: AC

## 2020-11-09 DIAGNOSIS — E1122 Type 2 diabetes mellitus with diabetic chronic kidney disease: Secondary | ICD-10-CM | POA: Diagnosis not present

## 2020-11-09 DIAGNOSIS — D631 Anemia in chronic kidney disease: Secondary | ICD-10-CM | POA: Diagnosis not present

## 2020-11-09 DIAGNOSIS — M79661 Pain in right lower leg: Secondary | ICD-10-CM | POA: Diagnosis not present

## 2020-11-09 DIAGNOSIS — I350 Nonrheumatic aortic (valve) stenosis: Secondary | ICD-10-CM | POA: Diagnosis not present

## 2020-11-09 DIAGNOSIS — K5901 Slow transit constipation: Secondary | ICD-10-CM | POA: Diagnosis not present

## 2020-11-09 DIAGNOSIS — I129 Hypertensive chronic kidney disease with stage 1 through stage 4 chronic kidney disease, or unspecified chronic kidney disease: Secondary | ICD-10-CM | POA: Diagnosis not present

## 2020-11-09 DIAGNOSIS — Z79891 Long term (current) use of opiate analgesic: Secondary | ICD-10-CM | POA: Diagnosis not present

## 2020-11-09 DIAGNOSIS — E1142 Type 2 diabetes mellitus with diabetic polyneuropathy: Secondary | ICD-10-CM | POA: Diagnosis not present

## 2020-11-09 DIAGNOSIS — R270 Ataxia, unspecified: Secondary | ICD-10-CM | POA: Diagnosis not present

## 2020-11-09 DIAGNOSIS — M79662 Pain in left lower leg: Secondary | ICD-10-CM | POA: Diagnosis not present

## 2020-11-09 DIAGNOSIS — H3530 Unspecified macular degeneration: Secondary | ICD-10-CM | POA: Diagnosis not present

## 2020-11-09 DIAGNOSIS — Z7984 Long term (current) use of oral hypoglycemic drugs: Secondary | ICD-10-CM | POA: Diagnosis not present

## 2020-11-09 DIAGNOSIS — M810 Age-related osteoporosis without current pathological fracture: Secondary | ICD-10-CM | POA: Diagnosis not present

## 2020-11-09 DIAGNOSIS — K219 Gastro-esophageal reflux disease without esophagitis: Secondary | ICD-10-CM | POA: Diagnosis not present

## 2020-11-09 DIAGNOSIS — N1831 Chronic kidney disease, stage 3a: Secondary | ICD-10-CM | POA: Diagnosis not present

## 2020-11-09 DIAGNOSIS — E1151 Type 2 diabetes mellitus with diabetic peripheral angiopathy without gangrene: Secondary | ICD-10-CM | POA: Diagnosis not present

## 2020-11-10 DIAGNOSIS — I129 Hypertensive chronic kidney disease with stage 1 through stage 4 chronic kidney disease, or unspecified chronic kidney disease: Secondary | ICD-10-CM | POA: Diagnosis not present

## 2020-11-10 DIAGNOSIS — M810 Age-related osteoporosis without current pathological fracture: Secondary | ICD-10-CM | POA: Diagnosis not present

## 2020-11-10 DIAGNOSIS — E119 Type 2 diabetes mellitus without complications: Secondary | ICD-10-CM | POA: Diagnosis not present

## 2020-11-10 DIAGNOSIS — I10 Essential (primary) hypertension: Secondary | ICD-10-CM | POA: Diagnosis not present

## 2020-11-11 DIAGNOSIS — N1831 Chronic kidney disease, stage 3a: Secondary | ICD-10-CM | POA: Diagnosis not present

## 2020-11-11 DIAGNOSIS — I129 Hypertensive chronic kidney disease with stage 1 through stage 4 chronic kidney disease, or unspecified chronic kidney disease: Secondary | ICD-10-CM | POA: Diagnosis not present

## 2020-11-11 DIAGNOSIS — R52 Pain, unspecified: Secondary | ICD-10-CM | POA: Diagnosis not present

## 2020-11-11 DIAGNOSIS — E1122 Type 2 diabetes mellitus with diabetic chronic kidney disease: Secondary | ICD-10-CM | POA: Diagnosis not present

## 2020-11-11 DIAGNOSIS — N3000 Acute cystitis without hematuria: Secondary | ICD-10-CM | POA: Diagnosis not present

## 2020-11-11 DIAGNOSIS — M170 Bilateral primary osteoarthritis of knee: Secondary | ICD-10-CM | POA: Diagnosis not present

## 2020-11-20 DIAGNOSIS — E0840 Diabetes mellitus due to underlying condition with diabetic neuropathy, unspecified: Secondary | ICD-10-CM | POA: Diagnosis not present

## 2020-11-20 DIAGNOSIS — M2041 Other hammer toe(s) (acquired), right foot: Secondary | ICD-10-CM | POA: Diagnosis not present

## 2020-11-20 DIAGNOSIS — M79675 Pain in left toe(s): Secondary | ICD-10-CM | POA: Diagnosis not present

## 2020-11-20 DIAGNOSIS — B351 Tinea unguium: Secondary | ICD-10-CM | POA: Diagnosis not present

## 2020-12-02 DIAGNOSIS — F331 Major depressive disorder, recurrent, moderate: Secondary | ICD-10-CM | POA: Diagnosis not present

## 2020-12-02 DIAGNOSIS — F411 Generalized anxiety disorder: Secondary | ICD-10-CM | POA: Diagnosis not present

## 2020-12-09 DIAGNOSIS — N1831 Chronic kidney disease, stage 3a: Secondary | ICD-10-CM | POA: Diagnosis not present

## 2020-12-09 DIAGNOSIS — H3530 Unspecified macular degeneration: Secondary | ICD-10-CM | POA: Diagnosis not present

## 2020-12-09 DIAGNOSIS — I129 Hypertensive chronic kidney disease with stage 1 through stage 4 chronic kidney disease, or unspecified chronic kidney disease: Secondary | ICD-10-CM | POA: Diagnosis not present

## 2020-12-09 DIAGNOSIS — E1151 Type 2 diabetes mellitus with diabetic peripheral angiopathy without gangrene: Secondary | ICD-10-CM | POA: Diagnosis not present

## 2020-12-09 DIAGNOSIS — E1142 Type 2 diabetes mellitus with diabetic polyneuropathy: Secondary | ICD-10-CM | POA: Diagnosis not present

## 2020-12-09 DIAGNOSIS — K219 Gastro-esophageal reflux disease without esophagitis: Secondary | ICD-10-CM | POA: Diagnosis not present

## 2020-12-09 DIAGNOSIS — R270 Ataxia, unspecified: Secondary | ICD-10-CM | POA: Diagnosis not present

## 2020-12-09 DIAGNOSIS — M810 Age-related osteoporosis without current pathological fracture: Secondary | ICD-10-CM | POA: Diagnosis not present

## 2020-12-09 DIAGNOSIS — E1122 Type 2 diabetes mellitus with diabetic chronic kidney disease: Secondary | ICD-10-CM | POA: Diagnosis not present

## 2020-12-09 DIAGNOSIS — I350 Nonrheumatic aortic (valve) stenosis: Secondary | ICD-10-CM | POA: Diagnosis not present

## 2020-12-09 DIAGNOSIS — Z79891 Long term (current) use of opiate analgesic: Secondary | ICD-10-CM | POA: Diagnosis not present

## 2020-12-09 DIAGNOSIS — D631 Anemia in chronic kidney disease: Secondary | ICD-10-CM | POA: Diagnosis not present

## 2020-12-09 DIAGNOSIS — M79662 Pain in left lower leg: Secondary | ICD-10-CM | POA: Diagnosis not present

## 2020-12-09 DIAGNOSIS — Z7984 Long term (current) use of oral hypoglycemic drugs: Secondary | ICD-10-CM | POA: Diagnosis not present

## 2020-12-09 DIAGNOSIS — K5901 Slow transit constipation: Secondary | ICD-10-CM | POA: Diagnosis not present

## 2020-12-09 DIAGNOSIS — M79661 Pain in right lower leg: Secondary | ICD-10-CM | POA: Diagnosis not present

## 2020-12-12 DIAGNOSIS — I10 Essential (primary) hypertension: Secondary | ICD-10-CM | POA: Diagnosis not present

## 2020-12-12 DIAGNOSIS — N1831 Chronic kidney disease, stage 3a: Secondary | ICD-10-CM | POA: Diagnosis not present

## 2020-12-12 DIAGNOSIS — E559 Vitamin D deficiency, unspecified: Secondary | ICD-10-CM | POA: Diagnosis not present

## 2020-12-25 DIAGNOSIS — R630 Anorexia: Secondary | ICD-10-CM | POA: Diagnosis not present

## 2020-12-25 DIAGNOSIS — R339 Retention of urine, unspecified: Secondary | ICD-10-CM | POA: Diagnosis not present

## 2020-12-25 DIAGNOSIS — N1831 Chronic kidney disease, stage 3a: Secondary | ICD-10-CM | POA: Diagnosis not present

## 2020-12-25 DIAGNOSIS — E1343 Other specified diabetes mellitus with diabetic autonomic (poly)neuropathy: Secondary | ICD-10-CM | POA: Diagnosis not present

## 2020-12-30 DIAGNOSIS — F411 Generalized anxiety disorder: Secondary | ICD-10-CM | POA: Diagnosis not present

## 2020-12-30 DIAGNOSIS — F331 Major depressive disorder, recurrent, moderate: Secondary | ICD-10-CM | POA: Diagnosis not present

## 2021-01-02 DIAGNOSIS — N390 Urinary tract infection, site not specified: Secondary | ICD-10-CM | POA: Diagnosis not present

## 2021-01-02 DIAGNOSIS — E1343 Other specified diabetes mellitus with diabetic autonomic (poly)neuropathy: Secondary | ICD-10-CM | POA: Diagnosis not present

## 2021-01-02 DIAGNOSIS — R350 Frequency of micturition: Secondary | ICD-10-CM | POA: Diagnosis not present

## 2021-01-02 DIAGNOSIS — I129 Hypertensive chronic kidney disease with stage 1 through stage 4 chronic kidney disease, or unspecified chronic kidney disease: Secondary | ICD-10-CM | POA: Diagnosis not present

## 2021-01-06 DIAGNOSIS — R339 Retention of urine, unspecified: Secondary | ICD-10-CM | POA: Diagnosis not present

## 2021-01-06 DIAGNOSIS — N1831 Chronic kidney disease, stage 3a: Secondary | ICD-10-CM | POA: Diagnosis not present

## 2021-01-06 DIAGNOSIS — K5901 Slow transit constipation: Secondary | ICD-10-CM | POA: Diagnosis not present

## 2021-01-06 DIAGNOSIS — K219 Gastro-esophageal reflux disease without esophagitis: Secondary | ICD-10-CM | POA: Diagnosis not present

## 2021-01-08 DIAGNOSIS — M810 Age-related osteoporosis without current pathological fracture: Secondary | ICD-10-CM | POA: Diagnosis not present

## 2021-01-08 DIAGNOSIS — K219 Gastro-esophageal reflux disease without esophagitis: Secondary | ICD-10-CM | POA: Diagnosis not present

## 2021-01-08 DIAGNOSIS — Z7984 Long term (current) use of oral hypoglycemic drugs: Secondary | ICD-10-CM | POA: Diagnosis not present

## 2021-01-08 DIAGNOSIS — E1151 Type 2 diabetes mellitus with diabetic peripheral angiopathy without gangrene: Secondary | ICD-10-CM | POA: Diagnosis not present

## 2021-01-08 DIAGNOSIS — I350 Nonrheumatic aortic (valve) stenosis: Secondary | ICD-10-CM | POA: Diagnosis not present

## 2021-01-08 DIAGNOSIS — I129 Hypertensive chronic kidney disease with stage 1 through stage 4 chronic kidney disease, or unspecified chronic kidney disease: Secondary | ICD-10-CM | POA: Diagnosis not present

## 2021-01-08 DIAGNOSIS — E1122 Type 2 diabetes mellitus with diabetic chronic kidney disease: Secondary | ICD-10-CM | POA: Diagnosis not present

## 2021-01-08 DIAGNOSIS — D631 Anemia in chronic kidney disease: Secondary | ICD-10-CM | POA: Diagnosis not present

## 2021-01-08 DIAGNOSIS — N3000 Acute cystitis without hematuria: Secondary | ICD-10-CM | POA: Diagnosis not present

## 2021-01-08 DIAGNOSIS — Z79891 Long term (current) use of opiate analgesic: Secondary | ICD-10-CM | POA: Diagnosis not present

## 2021-01-08 DIAGNOSIS — K5901 Slow transit constipation: Secondary | ICD-10-CM | POA: Diagnosis not present

## 2021-01-08 DIAGNOSIS — R270 Ataxia, unspecified: Secondary | ICD-10-CM | POA: Diagnosis not present

## 2021-01-08 DIAGNOSIS — E1142 Type 2 diabetes mellitus with diabetic polyneuropathy: Secondary | ICD-10-CM | POA: Diagnosis not present

## 2021-01-08 DIAGNOSIS — H3530 Unspecified macular degeneration: Secondary | ICD-10-CM | POA: Diagnosis not present

## 2021-01-08 DIAGNOSIS — N1831 Chronic kidney disease, stage 3a: Secondary | ICD-10-CM | POA: Diagnosis not present

## 2021-01-08 DIAGNOSIS — B961 Klebsiella pneumoniae [K. pneumoniae] as the cause of diseases classified elsewhere: Secondary | ICD-10-CM | POA: Diagnosis not present

## 2021-02-02 ENCOUNTER — Other Ambulatory Visit: Payer: Self-pay | Admitting: Nurse Practitioner

## 2021-02-02 DIAGNOSIS — E1142 Type 2 diabetes mellitus with diabetic polyneuropathy: Secondary | ICD-10-CM

## 2021-02-02 DIAGNOSIS — I10 Essential (primary) hypertension: Secondary | ICD-10-CM

## 2021-02-03 DIAGNOSIS — E1122 Type 2 diabetes mellitus with diabetic chronic kidney disease: Secondary | ICD-10-CM | POA: Diagnosis not present

## 2021-02-03 DIAGNOSIS — N1831 Chronic kidney disease, stage 3a: Secondary | ICD-10-CM | POA: Diagnosis not present

## 2021-02-03 DIAGNOSIS — I129 Hypertensive chronic kidney disease with stage 1 through stage 4 chronic kidney disease, or unspecified chronic kidney disease: Secondary | ICD-10-CM | POA: Diagnosis not present

## 2021-02-03 DIAGNOSIS — K219 Gastro-esophageal reflux disease without esophagitis: Secondary | ICD-10-CM | POA: Diagnosis not present

## 2021-02-07 DIAGNOSIS — Z79891 Long term (current) use of opiate analgesic: Secondary | ICD-10-CM | POA: Diagnosis not present

## 2021-02-07 DIAGNOSIS — E1142 Type 2 diabetes mellitus with diabetic polyneuropathy: Secondary | ICD-10-CM | POA: Diagnosis not present

## 2021-02-07 DIAGNOSIS — N1831 Chronic kidney disease, stage 3a: Secondary | ICD-10-CM | POA: Diagnosis not present

## 2021-02-07 DIAGNOSIS — R270 Ataxia, unspecified: Secondary | ICD-10-CM | POA: Diagnosis not present

## 2021-02-07 DIAGNOSIS — Z7984 Long term (current) use of oral hypoglycemic drugs: Secondary | ICD-10-CM | POA: Diagnosis not present

## 2021-02-07 DIAGNOSIS — E1151 Type 2 diabetes mellitus with diabetic peripheral angiopathy without gangrene: Secondary | ICD-10-CM | POA: Diagnosis not present

## 2021-02-07 DIAGNOSIS — I350 Nonrheumatic aortic (valve) stenosis: Secondary | ICD-10-CM | POA: Diagnosis not present

## 2021-02-07 DIAGNOSIS — B961 Klebsiella pneumoniae [K. pneumoniae] as the cause of diseases classified elsewhere: Secondary | ICD-10-CM | POA: Diagnosis not present

## 2021-02-07 DIAGNOSIS — K219 Gastro-esophageal reflux disease without esophagitis: Secondary | ICD-10-CM | POA: Diagnosis not present

## 2021-02-07 DIAGNOSIS — I129 Hypertensive chronic kidney disease with stage 1 through stage 4 chronic kidney disease, or unspecified chronic kidney disease: Secondary | ICD-10-CM | POA: Diagnosis not present

## 2021-02-07 DIAGNOSIS — M810 Age-related osteoporosis without current pathological fracture: Secondary | ICD-10-CM | POA: Diagnosis not present

## 2021-02-07 DIAGNOSIS — K5901 Slow transit constipation: Secondary | ICD-10-CM | POA: Diagnosis not present

## 2021-02-07 DIAGNOSIS — N3000 Acute cystitis without hematuria: Secondary | ICD-10-CM | POA: Diagnosis not present

## 2021-02-07 DIAGNOSIS — D631 Anemia in chronic kidney disease: Secondary | ICD-10-CM | POA: Diagnosis not present

## 2021-02-07 DIAGNOSIS — H3530 Unspecified macular degeneration: Secondary | ICD-10-CM | POA: Diagnosis not present

## 2021-02-07 DIAGNOSIS — E1122 Type 2 diabetes mellitus with diabetic chronic kidney disease: Secondary | ICD-10-CM | POA: Diagnosis not present

## 2021-02-09 DIAGNOSIS — I129 Hypertensive chronic kidney disease with stage 1 through stage 4 chronic kidney disease, or unspecified chronic kidney disease: Secondary | ICD-10-CM | POA: Diagnosis not present

## 2021-02-09 DIAGNOSIS — E1343 Other specified diabetes mellitus with diabetic autonomic (poly)neuropathy: Secondary | ICD-10-CM | POA: Diagnosis not present

## 2021-02-10 DIAGNOSIS — F411 Generalized anxiety disorder: Secondary | ICD-10-CM | POA: Diagnosis not present

## 2021-02-10 DIAGNOSIS — F331 Major depressive disorder, recurrent, moderate: Secondary | ICD-10-CM | POA: Diagnosis not present

## 2021-02-17 DIAGNOSIS — K219 Gastro-esophageal reflux disease without esophagitis: Secondary | ICD-10-CM | POA: Diagnosis not present

## 2021-02-17 DIAGNOSIS — K5901 Slow transit constipation: Secondary | ICD-10-CM | POA: Diagnosis not present

## 2021-02-17 DIAGNOSIS — N1831 Chronic kidney disease, stage 3a: Secondary | ICD-10-CM | POA: Diagnosis not present

## 2021-02-17 DIAGNOSIS — I129 Hypertensive chronic kidney disease with stage 1 through stage 4 chronic kidney disease, or unspecified chronic kidney disease: Secondary | ICD-10-CM | POA: Diagnosis not present

## 2021-08-11 DEATH — deceased
